# Patient Record
Sex: Male | Born: 1945 | Race: White | Hispanic: No | Marital: Married | Smoking: Never smoker
Health system: Southern US, Community
[De-identification: ages and names within clinical notes are randomized; demographics above are authoritative.]

## PROBLEM LIST (undated history)

## (undated) DIAGNOSIS — T4145XA Adverse effect of unspecified anesthetic, initial encounter: Secondary | ICD-10-CM

## (undated) DIAGNOSIS — E039 Hypothyroidism, unspecified: Secondary | ICD-10-CM

## (undated) DIAGNOSIS — N138 Other obstructive and reflux uropathy: Secondary | ICD-10-CM

## (undated) DIAGNOSIS — F32A Depression, unspecified: Secondary | ICD-10-CM

## (undated) DIAGNOSIS — Z973 Presence of spectacles and contact lenses: Secondary | ICD-10-CM

## (undated) DIAGNOSIS — Z8781 Personal history of (healed) traumatic fracture: Secondary | ICD-10-CM

## (undated) DIAGNOSIS — F329 Major depressive disorder, single episode, unspecified: Secondary | ICD-10-CM

## (undated) DIAGNOSIS — K5790 Diverticulosis of intestine, part unspecified, without perforation or abscess without bleeding: Secondary | ICD-10-CM

## (undated) DIAGNOSIS — N401 Enlarged prostate with lower urinary tract symptoms: Secondary | ICD-10-CM

## (undated) DIAGNOSIS — T8859XA Other complications of anesthesia, initial encounter: Secondary | ICD-10-CM

## (undated) DIAGNOSIS — M1990 Unspecified osteoarthritis, unspecified site: Secondary | ICD-10-CM

## (undated) HISTORY — PX: CHOLECYSTECTOMY: SHX55

## (undated) HISTORY — PX: COLONOSCOPY: SHX174

## (undated) HISTORY — PX: KNEE ARTHROSCOPY: SUR90

## (undated) HISTORY — PX: CIRCUMCISION: SUR203

---
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
## 1978-09-19 HISTORY — PX: BACK SURGERY: SHX140

## 1978-09-19 HISTORY — PX: KNEE ARTHROSCOPY AND ARTHROTOMY: SUR84

## 2006-08-24 DIAGNOSIS — K5730 Diverticulosis of large intestine without perforation or abscess without bleeding: Secondary | ICD-10-CM | POA: Insufficient documentation

## 2009-12-11 DIAGNOSIS — E785 Hyperlipidemia, unspecified: Secondary | ICD-10-CM | POA: Insufficient documentation

## 2009-12-13 DIAGNOSIS — Z9889 Other specified postprocedural states: Secondary | ICD-10-CM | POA: Insufficient documentation

## 2015-08-18 DIAGNOSIS — R1032 Left lower quadrant pain: Secondary | ICD-10-CM | POA: Diagnosis not present

## 2015-08-26 DIAGNOSIS — E039 Hypothyroidism, unspecified: Secondary | ICD-10-CM | POA: Diagnosis not present

## 2015-08-26 DIAGNOSIS — N400 Enlarged prostate without lower urinary tract symptoms: Secondary | ICD-10-CM | POA: Diagnosis not present

## 2015-08-26 DIAGNOSIS — E782 Mixed hyperlipidemia: Secondary | ICD-10-CM | POA: Diagnosis not present

## 2015-08-26 DIAGNOSIS — M1991 Primary osteoarthritis, unspecified site: Secondary | ICD-10-CM | POA: Diagnosis not present

## 2015-09-02 DIAGNOSIS — Z8042 Family history of malignant neoplasm of prostate: Secondary | ICD-10-CM | POA: Diagnosis not present

## 2015-09-02 DIAGNOSIS — Z0001 Encounter for general adult medical examination with abnormal findings: Secondary | ICD-10-CM | POA: Diagnosis not present

## 2015-09-02 DIAGNOSIS — E039 Hypothyroidism, unspecified: Secondary | ICD-10-CM | POA: Diagnosis not present

## 2015-09-02 DIAGNOSIS — E782 Mixed hyperlipidemia: Secondary | ICD-10-CM | POA: Diagnosis not present

## 2015-09-02 DIAGNOSIS — N400 Enlarged prostate without lower urinary tract symptoms: Secondary | ICD-10-CM | POA: Diagnosis not present

## 2015-09-02 DIAGNOSIS — Z23 Encounter for immunization: Secondary | ICD-10-CM | POA: Diagnosis not present

## 2015-11-24 DIAGNOSIS — R972 Elevated prostate specific antigen [PSA]: Secondary | ICD-10-CM | POA: Diagnosis not present

## 2015-12-03 DIAGNOSIS — R972 Elevated prostate specific antigen [PSA]: Secondary | ICD-10-CM | POA: Diagnosis not present

## 2015-12-30 DIAGNOSIS — R972 Elevated prostate specific antigen [PSA]: Secondary | ICD-10-CM | POA: Diagnosis not present

## 2016-01-07 DIAGNOSIS — R972 Elevated prostate specific antigen [PSA]: Secondary | ICD-10-CM | POA: Diagnosis not present

## 2016-03-02 DIAGNOSIS — E039 Hypothyroidism, unspecified: Secondary | ICD-10-CM | POA: Diagnosis not present

## 2016-07-08 DIAGNOSIS — R972 Elevated prostate specific antigen [PSA]: Secondary | ICD-10-CM | POA: Diagnosis not present

## 2016-07-19 DIAGNOSIS — R39198 Other difficulties with micturition: Secondary | ICD-10-CM | POA: Diagnosis not present

## 2016-07-19 DIAGNOSIS — Z8042 Family history of malignant neoplasm of prostate: Secondary | ICD-10-CM | POA: Diagnosis not present

## 2016-07-19 DIAGNOSIS — R972 Elevated prostate specific antigen [PSA]: Secondary | ICD-10-CM | POA: Diagnosis not present

## 2016-09-02 DIAGNOSIS — R972 Elevated prostate specific antigen [PSA]: Secondary | ICD-10-CM | POA: Diagnosis not present

## 2016-09-02 DIAGNOSIS — E782 Mixed hyperlipidemia: Secondary | ICD-10-CM | POA: Diagnosis not present

## 2016-09-02 DIAGNOSIS — E039 Hypothyroidism, unspecified: Secondary | ICD-10-CM | POA: Diagnosis not present

## 2016-09-06 DIAGNOSIS — E039 Hypothyroidism, unspecified: Secondary | ICD-10-CM | POA: Diagnosis not present

## 2016-09-06 DIAGNOSIS — Z0001 Encounter for general adult medical examination with abnormal findings: Secondary | ICD-10-CM | POA: Diagnosis not present

## 2016-09-06 DIAGNOSIS — N400 Enlarged prostate without lower urinary tract symptoms: Secondary | ICD-10-CM | POA: Diagnosis not present

## 2016-09-06 DIAGNOSIS — E782 Mixed hyperlipidemia: Secondary | ICD-10-CM | POA: Diagnosis not present

## 2016-09-06 DIAGNOSIS — Z8042 Family history of malignant neoplasm of prostate: Secondary | ICD-10-CM | POA: Diagnosis not present

## 2016-12-07 DIAGNOSIS — E039 Hypothyroidism, unspecified: Secondary | ICD-10-CM | POA: Diagnosis not present

## 2017-01-10 DIAGNOSIS — H1132 Conjunctival hemorrhage, left eye: Secondary | ICD-10-CM | POA: Diagnosis not present

## 2017-02-03 ENCOUNTER — Ambulatory Visit (INDEPENDENT_AMBULATORY_CARE_PROVIDER_SITE_OTHER): Payer: Medicare HMO | Admitting: Urology

## 2017-02-03 DIAGNOSIS — R3912 Poor urinary stream: Secondary | ICD-10-CM | POA: Diagnosis not present

## 2017-02-03 DIAGNOSIS — N401 Enlarged prostate with lower urinary tract symptoms: Secondary | ICD-10-CM

## 2017-02-03 DIAGNOSIS — R972 Elevated prostate specific antigen [PSA]: Secondary | ICD-10-CM | POA: Diagnosis not present

## 2017-02-27 DIAGNOSIS — M5416 Radiculopathy, lumbar region: Secondary | ICD-10-CM | POA: Diagnosis not present

## 2017-02-27 DIAGNOSIS — Z6827 Body mass index (BMI) 27.0-27.9, adult: Secondary | ICD-10-CM | POA: Diagnosis not present

## 2017-02-27 DIAGNOSIS — M7551 Bursitis of right shoulder: Secondary | ICD-10-CM | POA: Diagnosis not present

## 2017-07-20 DIAGNOSIS — R69 Illness, unspecified: Secondary | ICD-10-CM | POA: Diagnosis not present

## 2017-08-04 ENCOUNTER — Ambulatory Visit: Payer: Medicare HMO | Admitting: Urology

## 2017-08-04 DIAGNOSIS — R972 Elevated prostate specific antigen [PSA]: Secondary | ICD-10-CM

## 2017-08-04 DIAGNOSIS — R3912 Poor urinary stream: Secondary | ICD-10-CM

## 2017-08-04 DIAGNOSIS — N401 Enlarged prostate with lower urinary tract symptoms: Secondary | ICD-10-CM | POA: Diagnosis not present

## 2017-08-16 ENCOUNTER — Other Ambulatory Visit: Payer: Self-pay | Admitting: Urology

## 2017-08-16 DIAGNOSIS — R972 Elevated prostate specific antigen [PSA]: Secondary | ICD-10-CM

## 2017-08-24 ENCOUNTER — Ambulatory Visit (HOSPITAL_COMMUNITY)
Admission: RE | Admit: 2017-08-24 | Discharge: 2017-08-24 | Disposition: A | Discharge status: Home / Self Care | Payer: Medicare HMO | Source: Ambulatory Visit | Attending: Urology | Admitting: Urology

## 2017-08-24 DIAGNOSIS — R972 Elevated prostate specific antigen [PSA]: Secondary | ICD-10-CM | POA: Diagnosis not present

## 2017-08-24 DIAGNOSIS — N400 Enlarged prostate without lower urinary tract symptoms: Secondary | ICD-10-CM | POA: Diagnosis not present

## 2017-08-24 LAB — POCT I-STAT CREATININE: CREATININE: 0.9 mg/dL (ref 0.61–1.24)

## 2017-08-24 MED ORDER — GADOBENATE DIMEGLUMINE 529 MG/ML IV SOLN
20.0000 mL | Freq: Once | INTRAVENOUS | Status: AC | PRN
  Administered 2017-08-24: 19 mL via INTRAVENOUS

## 2017-08-24 MED ORDER — LIDOCAINE HCL 2 % EX GEL
CUTANEOUS | Status: AC
  Filled 2017-08-24: qty 30

## 2017-09-05 DIAGNOSIS — R972 Elevated prostate specific antigen [PSA]: Secondary | ICD-10-CM | POA: Diagnosis not present

## 2017-09-05 DIAGNOSIS — E039 Hypothyroidism, unspecified: Secondary | ICD-10-CM | POA: Diagnosis not present

## 2017-09-05 DIAGNOSIS — E782 Mixed hyperlipidemia: Secondary | ICD-10-CM | POA: Diagnosis not present

## 2017-09-05 DIAGNOSIS — M1991 Primary osteoarthritis, unspecified site: Secondary | ICD-10-CM | POA: Diagnosis not present

## 2017-09-05 DIAGNOSIS — Z8042 Family history of malignant neoplasm of prostate: Secondary | ICD-10-CM | POA: Diagnosis not present

## 2017-09-29 DIAGNOSIS — E782 Mixed hyperlipidemia: Secondary | ICD-10-CM | POA: Diagnosis not present

## 2017-09-29 DIAGNOSIS — Z0001 Encounter for general adult medical examination with abnormal findings: Secondary | ICD-10-CM | POA: Diagnosis not present

## 2017-09-29 DIAGNOSIS — N401 Enlarged prostate with lower urinary tract symptoms: Secondary | ICD-10-CM | POA: Diagnosis not present

## 2017-09-29 DIAGNOSIS — L570 Actinic keratosis: Secondary | ICD-10-CM | POA: Diagnosis not present

## 2017-09-29 DIAGNOSIS — Z6827 Body mass index (BMI) 27.0-27.9, adult: Secondary | ICD-10-CM | POA: Diagnosis not present

## 2017-09-29 DIAGNOSIS — R972 Elevated prostate specific antigen [PSA]: Secondary | ICD-10-CM | POA: Diagnosis not present

## 2017-09-29 DIAGNOSIS — E039 Hypothyroidism, unspecified: Secondary | ICD-10-CM | POA: Diagnosis not present

## 2018-02-16 ENCOUNTER — Ambulatory Visit: Payer: Medicare HMO | Admitting: Urology

## 2018-02-16 DIAGNOSIS — R3912 Poor urinary stream: Secondary | ICD-10-CM

## 2018-02-16 DIAGNOSIS — N401 Enlarged prostate with lower urinary tract symptoms: Secondary | ICD-10-CM

## 2018-02-16 DIAGNOSIS — R972 Elevated prostate specific antigen [PSA]: Secondary | ICD-10-CM | POA: Diagnosis not present

## 2018-03-29 DIAGNOSIS — E039 Hypothyroidism, unspecified: Secondary | ICD-10-CM | POA: Diagnosis not present

## 2018-07-10 DIAGNOSIS — R69 Illness, unspecified: Secondary | ICD-10-CM | POA: Diagnosis not present

## 2018-08-22 DIAGNOSIS — R972 Elevated prostate specific antigen [PSA]: Secondary | ICD-10-CM | POA: Diagnosis not present

## 2018-08-22 DIAGNOSIS — R338 Other retention of urine: Secondary | ICD-10-CM | POA: Diagnosis not present

## 2018-08-27 DIAGNOSIS — R338 Other retention of urine: Secondary | ICD-10-CM | POA: Diagnosis not present

## 2018-08-27 DIAGNOSIS — N401 Enlarged prostate with lower urinary tract symptoms: Secondary | ICD-10-CM | POA: Diagnosis not present

## 2018-09-13 DIAGNOSIS — N401 Enlarged prostate with lower urinary tract symptoms: Secondary | ICD-10-CM | POA: Diagnosis not present

## 2018-09-13 DIAGNOSIS — R972 Elevated prostate specific antigen [PSA]: Secondary | ICD-10-CM | POA: Diagnosis not present

## 2018-09-13 DIAGNOSIS — R338 Other retention of urine: Secondary | ICD-10-CM | POA: Diagnosis not present

## 2018-09-27 DIAGNOSIS — R972 Elevated prostate specific antigen [PSA]: Secondary | ICD-10-CM | POA: Diagnosis not present

## 2018-09-27 DIAGNOSIS — N401 Enlarged prostate with lower urinary tract symptoms: Secondary | ICD-10-CM | POA: Diagnosis not present

## 2018-09-27 DIAGNOSIS — R338 Other retention of urine: Secondary | ICD-10-CM | POA: Diagnosis not present

## 2018-09-27 DIAGNOSIS — N3000 Acute cystitis without hematuria: Secondary | ICD-10-CM | POA: Diagnosis not present

## 2018-09-27 DIAGNOSIS — N451 Epididymitis: Secondary | ICD-10-CM | POA: Diagnosis not present

## 2018-09-27 DIAGNOSIS — R3912 Poor urinary stream: Secondary | ICD-10-CM | POA: Diagnosis not present

## 2018-10-09 DIAGNOSIS — E039 Hypothyroidism, unspecified: Secondary | ICD-10-CM | POA: Diagnosis not present

## 2018-10-09 DIAGNOSIS — E782 Mixed hyperlipidemia: Secondary | ICD-10-CM | POA: Diagnosis not present

## 2018-10-09 DIAGNOSIS — N400 Enlarged prostate without lower urinary tract symptoms: Secondary | ICD-10-CM | POA: Diagnosis not present

## 2018-10-09 DIAGNOSIS — Z8042 Family history of malignant neoplasm of prostate: Secondary | ICD-10-CM | POA: Diagnosis not present

## 2018-10-17 DIAGNOSIS — L0100 Impetigo, unspecified: Secondary | ICD-10-CM | POA: Diagnosis not present

## 2018-10-17 DIAGNOSIS — Z6827 Body mass index (BMI) 27.0-27.9, adult: Secondary | ICD-10-CM | POA: Diagnosis not present

## 2018-10-19 DIAGNOSIS — R338 Other retention of urine: Secondary | ICD-10-CM | POA: Diagnosis not present

## 2018-10-19 DIAGNOSIS — N401 Enlarged prostate with lower urinary tract symptoms: Secondary | ICD-10-CM | POA: Diagnosis not present

## 2018-10-22 ENCOUNTER — Other Ambulatory Visit: Payer: Self-pay | Admitting: Urology

## 2018-10-22 DIAGNOSIS — Z0001 Encounter for general adult medical examination with abnormal findings: Secondary | ICD-10-CM | POA: Diagnosis not present

## 2018-10-22 DIAGNOSIS — Z6827 Body mass index (BMI) 27.0-27.9, adult: Secondary | ICD-10-CM | POA: Diagnosis not present

## 2018-10-24 DIAGNOSIS — L0100 Impetigo, unspecified: Secondary | ICD-10-CM | POA: Diagnosis not present

## 2018-10-24 DIAGNOSIS — Z6828 Body mass index (BMI) 28.0-28.9, adult: Secondary | ICD-10-CM | POA: Diagnosis not present

## 2018-12-04 DIAGNOSIS — N401 Enlarged prostate with lower urinary tract symptoms: Secondary | ICD-10-CM | POA: Diagnosis not present

## 2018-12-04 DIAGNOSIS — R338 Other retention of urine: Secondary | ICD-10-CM | POA: Diagnosis not present

## 2018-12-05 NOTE — Progress Notes (Signed)
Pt called related to the COVID-19 presurgical screening. Pt denies cough, fever >100.4, runny nose, sore throat, difficulty breathing/shortness of breath, and domestic/international travel within the last 14 days.

## 2018-12-05 NOTE — Patient Instructions (Addendum)
Hector Kelley  12/05/2018   Your procedure is scheduled on: 12-10-18    Report to [facility] Main  Entrance    Report to [place] at 5:30 AM    Call this number if you have problems the morning of surgery [phone]   Remember: Do not eat food or drink liquids :After Midnight.    BRUSH YOUR TEETH MORNING OF SURGERY AND RINSE YOUR MOUTH OUT, NO CHEWING GUM CANDY OR MINTS.     CLEAR LIQUID DIET   Foods Allowed                                                                     Foods Excluded  Coffee and tea, regular and decaf                             liquids that you cannot  Plain Jell-O in any flavor                                             see through such as: Fruit ices (not with fruit pulp)                                     milk, soups, orange juice  Iced Popsicles                                    All solid food Carbonated beverages, regular and diet                                    Cranberry, grape and apple juices Sports drinks like Gatorade Lightly seasoned clear broth or consume(fat free) Sugar, honey syrup  Sample Menu Breakfast                                Lunch                                     Supper Cranberry juice                    Beef broth                            Chicken broth Jell-O                                     Grape juice  Apple juice Coffee or tea                        Jell-O                                      Popsicle                                                Coffee or tea                        Coffee or tea  _____________________________________________________________________     Take these medicines the morning of surgery with A SIP OF WATER: Levothyroxine (Synthroid), and Finasteride (Proscar)                                You may not have any metal on your body including hair pins and              piercings  Do not wear jewelry, cologne, lotions, powders or  deodorant             Men may shave face and neck.   Do not bring valuables to the hospital. [place].  Contacts, dentures or bridgework may not be worn into surgery.  Leave suitcase in the car. After surgery it may be brought to your room.     Patients discharged the day of surgery will not be allowed to drive home. IF YOU ARE HAVING SURGERY AND GOING HOME THE SAME DAY, YOU MUST HAVE AN ADULT TO DRIVE YOU HOME AND BE WITH YOU FOR 24 HOURS. YOU MAY GO HOME BY TAXI OR UBER OR ORTHERWISE, BUT AN ADULT MUST ACCOMPANY YOU HOME AND STAY WITH YOU FOR 24 HOURS.   Special Instructions: Please consume a Clear Liquid Diet along with your prep             Please read over the following fact sheets you were given: _____________________________________________________________________             [facility] - Preparing for Surgery Before surgery, you can play an important role.  Because skin is not sterile, your skin needs to be as free of germs as possible.  You can reduce the number of germs on your skin by washing with CHG (chlorahexidine gluconate) soap before surgery.  CHG is an antiseptic cleaner which kills germs and bonds with the skin to continue killing germs even after washing. Please DO NOT use if you have an allergy to CHG or antibacterial soaps.  If your skin becomes reddened/irritated stop using the CHG and inform your nurse when you arrive at Short Stay. Do not shave (including legs and underarms) for at least 48 hours prior to the first CHG shower.  You may shave your face/neck. Please follow these instructions carefully:  1.  Shower with CHG Soap the night before surgery and the  morning of Surgery.  2.  If you choose to wash your hair, wash your hair first as usual with your  normal  shampoo.  3.  After you shampoo, rinse your hair and body thoroughly to remove the  shampoo.                           4.  Use CHG as you would any other  liquid soap.  You can apply chg directly  to the skin and wash                       Gently with a scrungie or clean washcloth.  5.  Apply the CHG Soap to your body ONLY FROM THE NECK DOWN.   Do not use on face/ open                           Wound or open sores. Avoid contact with eyes, ears mouth and genitals (private parts).                       Wash face,  Genitals (private parts) with your normal soap.             6.  Wash thoroughly, paying special attention to the area where your surgery  will be performed.  7.  Thoroughly rinse your body with warm water from the neck down.  8.  DO NOT shower/wash with your normal soap after using and rinsing off  the CHG Soap.                9.  Pat yourself dry with a clean towel.            10.  Wear clean pajamas.            11.  Place clean sheets on your bed the night of your first shower and do not  sleep with pets. Day of Surgery : Do not apply any lotions/deodorants the morning of surgery.  Please wear clean clothes to the hospital/surgery center.  FAILURE TO FOLLOW THESE INSTRUCTIONS MAY RESULT IN THE CANCELLATION OF YOUR SURGERY PATIENT SIGNATURE_________________________________  NURSE SIGNATURE__________________________________  ________________________________________________________________________

## 2018-12-07 ENCOUNTER — Encounter (HOSPITAL_COMMUNITY)
Admission: RE | Admit: 2018-12-07 | Discharge: 2018-12-07 | Disposition: A | Discharge status: Home / Self Care | Payer: Medicare HMO | Source: Ambulatory Visit | Attending: Urology | Admitting: Urology

## 2018-12-07 ENCOUNTER — Other Ambulatory Visit: Payer: Self-pay

## 2018-12-07 ENCOUNTER — Encounter (HOSPITAL_COMMUNITY): Payer: Self-pay

## 2018-12-07 ENCOUNTER — Encounter (HOSPITAL_COMMUNITY): Payer: Self-pay | Admitting: Physician Assistant

## 2018-12-07 DIAGNOSIS — Z01812 Encounter for preprocedural laboratory examination: Secondary | ICD-10-CM | POA: Insufficient documentation

## 2018-12-07 DIAGNOSIS — R339 Retention of urine, unspecified: Secondary | ICD-10-CM | POA: Insufficient documentation

## 2018-12-07 DIAGNOSIS — N401 Enlarged prostate with lower urinary tract symptoms: Secondary | ICD-10-CM | POA: Diagnosis not present

## 2018-12-07 HISTORY — DX: Depression, unspecified: F32.A

## 2018-12-07 HISTORY — DX: Unspecified osteoarthritis, unspecified site: M19.90

## 2018-12-07 HISTORY — DX: Major depressive disorder, single episode, unspecified: F32.9

## 2018-12-07 HISTORY — DX: Hypothyroidism, unspecified: E03.9

## 2018-12-07 LAB — CBC
HCT: 43.3 % (ref 39.0–52.0)
Hemoglobin: 13.8 g/dL (ref 13.0–17.0)
MCH: 30.7 pg (ref 26.0–34.0)
MCHC: 31.9 g/dL (ref 30.0–36.0)
MCV: 96.2 fL (ref 80.0–100.0)
Platelets: 319 10*3/uL (ref 150–400)
RBC: 4.5 MIL/uL (ref 4.22–5.81)
RDW: 13.4 % (ref 11.5–15.5)
WBC: 5.4 10*3/uL (ref 4.0–10.5)
nRBC: 0 % (ref 0.0–0.2)

## 2018-12-07 LAB — BASIC METABOLIC PANEL
Anion gap: 6 (ref 5–15)
BUN: 16 mg/dL (ref 8–23)
CO2: 26 mmol/L (ref 22–32)
Calcium: 9.1 mg/dL (ref 8.9–10.3)
Chloride: 108 mmol/L (ref 98–111)
Creatinine, Ser: 1.01 mg/dL (ref 0.61–1.24)
GFR calc Af Amer: >60 mL/min (ref >60)
GFR calc non Af Amer: >60 mL/min (ref >60)
GLUCOSE: 100 mg/dL — AB (ref 70–99)
Potassium: 4.2 mmol/L (ref 3.5–5.1)
Sodium: 140 mmol/L (ref 135–145)

## 2018-12-10 ENCOUNTER — Encounter (HOSPITAL_COMMUNITY): Admission: RE | Payer: Self-pay | Source: Home / Self Care

## 2018-12-10 ENCOUNTER — Inpatient Hospital Stay (HOSPITAL_COMMUNITY): Admission: RE | Admit: 2018-12-10 | Payer: Medicare HMO | Source: Home / Self Care | Admitting: Urology

## 2018-12-10 SURGERY — PROSTATECTOMY, SIMPLE, ROBOT-ASSISTED
Anesthesia: General

## 2018-12-11 ENCOUNTER — Other Ambulatory Visit: Payer: Self-pay | Admitting: Urology

## 2018-12-21 IMAGING — MR MR PROSTATE WO/W CM
23 of 56 series · 23 of 56 positions shown · IV contrast (multihance)
Comparison: None.

CLINICAL DATA: Elevated PSA level. Benign findings on biopsy
05/21/2015

EXAM:
MR PROSTATE WITHOUT AND WITH CONTRAST
TECHNIQUE: Multiplanar multisequence MRI images were obtained of the pelvis
centered about the prostate. Pre and post contrast images were
obtained.
CONTRAST:  19mL MULTIHANCE GADOBENATE DIMEGLUMINE 529 MG/ML IV SOLN

[Series 3: bSSFP fat-sat · axial · 6.0mm · 0.86mm/px · 1 of 44 slices shown]
[im 1/44]
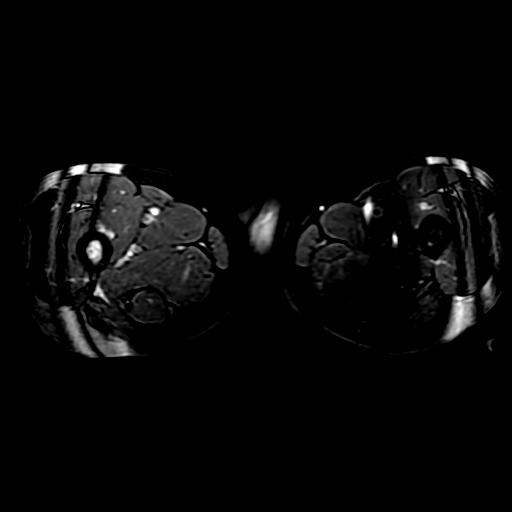

[Series 4: T1 · axial · 6.0mm · 0.86mm/px · 1 of 44 slices shown (1 of 2)]
[im 1/44]
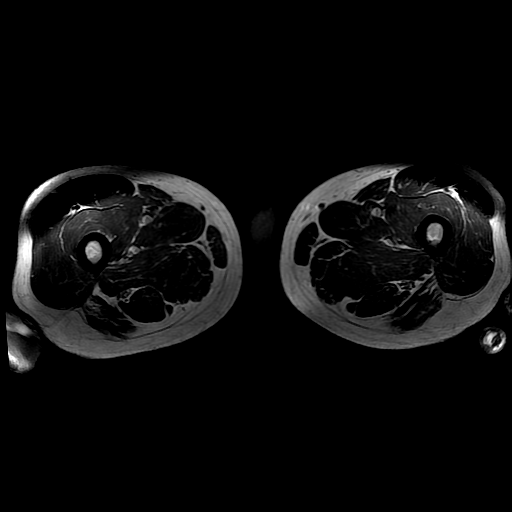

[Series 5: T2 · axial · 3.0mm · 0.29mm/px · 1 of 28 slices shown (1 of 4)]
[im 1/28]
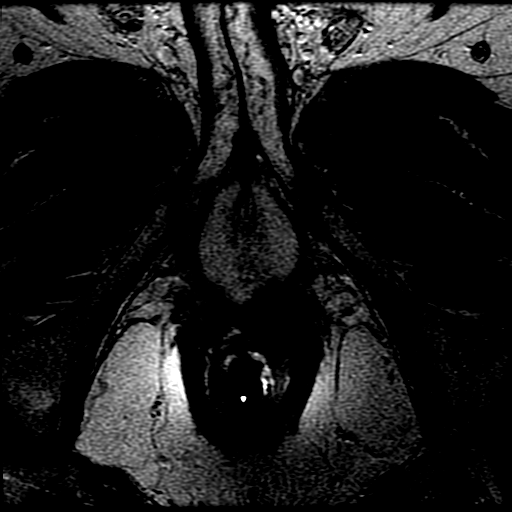

[Series 6: T1 · axial · 3.0mm · 0.29mm/px · 1 of 28 slices shown (2 of 2)]
[im 1/28]
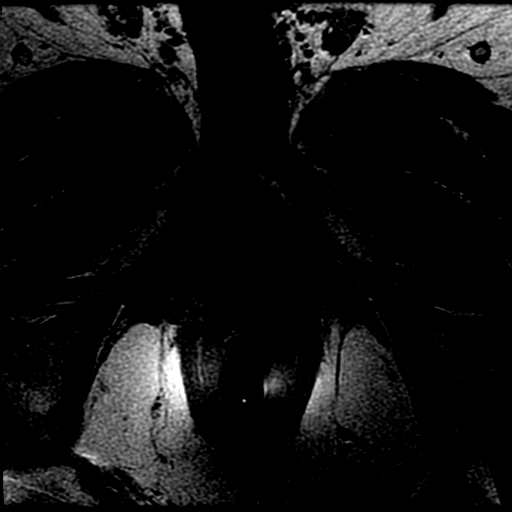

[Series 7: T2 · axial · 1.8mm · 0.47mm/px · 1 of 156 slices shown (2 of 4)]
[im 1/156]
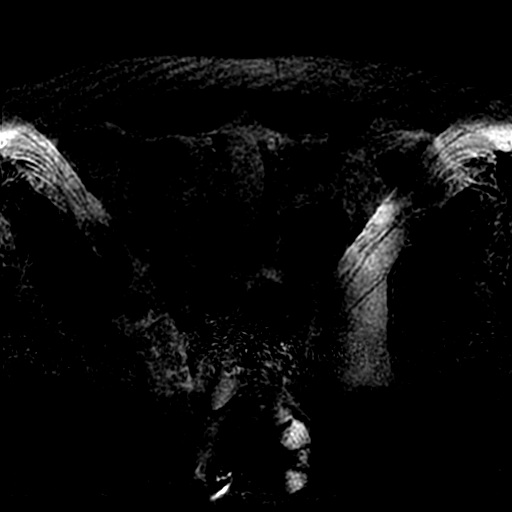

[Series 8: T2 · sagittal · 4.0mm · 0.29mm/px · 1 of 26 slices shown (3 of 4)]
[im 1/26]
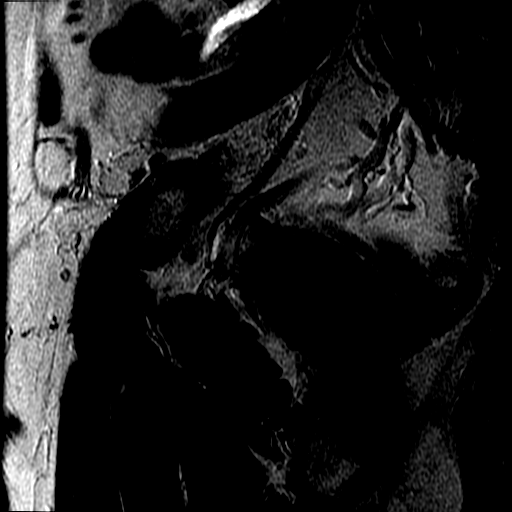

[Series 9: T2 · coronal · 4.0mm · 0.29mm/px · 1 of 28 slices shown (4 of 4)]
[im 1/28]
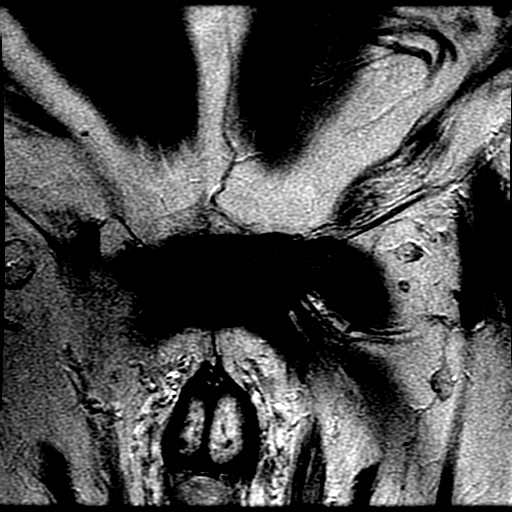

[Series 10: DWI · axial · 3.0mm · 0.59mm/px · 1 of 52 slices shown (1 of 6)]
[im 1/52]
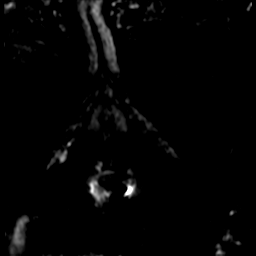

[Series 11: DWI · axial · 3.0mm · 0.59mm/px · 1 of 52 slices shown (2 of 6)]
[im 1/52]
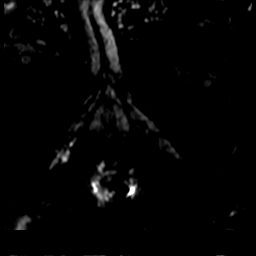

[Series 12: DWI · axial · 3.0mm · 0.59mm/px · 1 of 51 slices shown (3 of 6)]
[im 1/51]
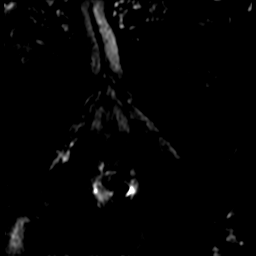

[Series 700: reformatted · axial · 1.8mm · 0.47mm/px · 1 of 70 slices shown (1 of 2)]
[im 1/70]
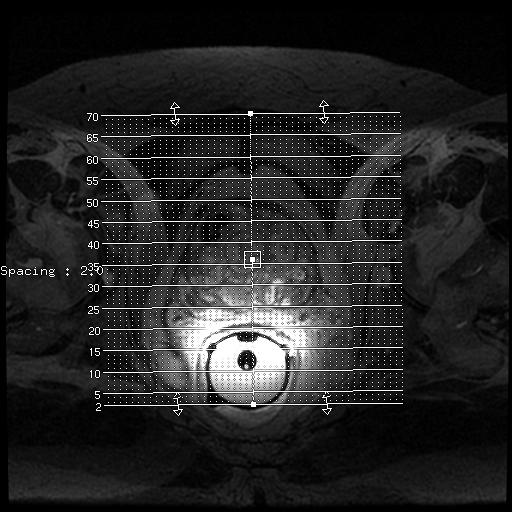

[Series 701: reformatted · axial · 1.8mm · 0.47mm/px · 1 of 64 slices shown (2 of 2)]
[im 1/64]
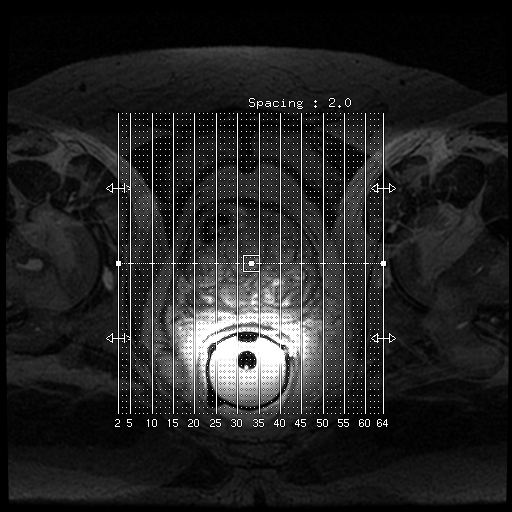

[Series 1000: DWI · axial · 3.0mm · 0.59mm/px · 1 of 26 slices shown (4 of 6)]
[im 1/26]
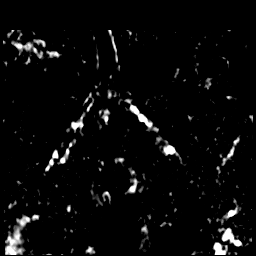

[Series 1100: DWI · axial · 3.0mm · 0.59mm/px · 1 of 26 slices shown (5 of 6)]
[im 1/26]
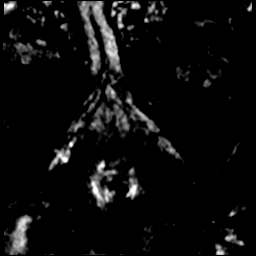

[Series 1200: DWI · axial · 3.0mm · 0.59mm/px · 1 of 26 slices shown (6 of 6)]
[im 1/26]
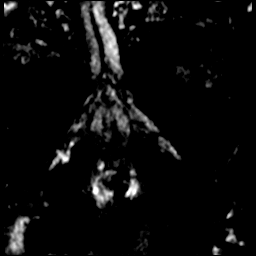

[((id)/(id)/1)-((id)/(id)/1) · axial · 3.0mm · 0.43mm/px · 1 of 73 slices shown (1 of 8)]
[im 1/73]
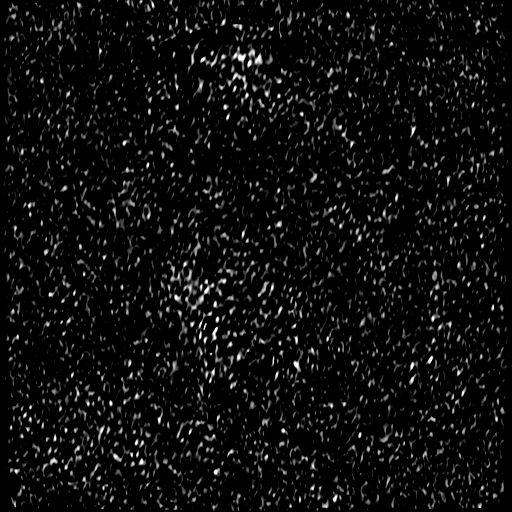

[((id)/(id)/1)-((id)/(id)/1) · axial · 3.0mm · 0.43mm/px · 1 of 75 slices shown (2 of 8)]
[im 1/75]
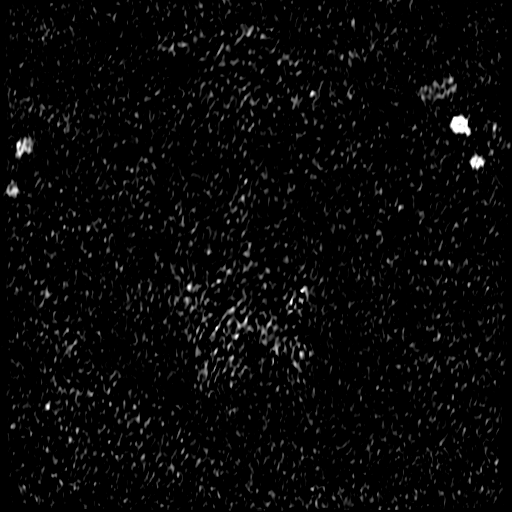

[((id)/(id)/1)-((id)/(id)/1) · axial · 3.0mm · 0.43mm/px · 1 of 62 slices shown (3 of 8)]
[im 1/62]
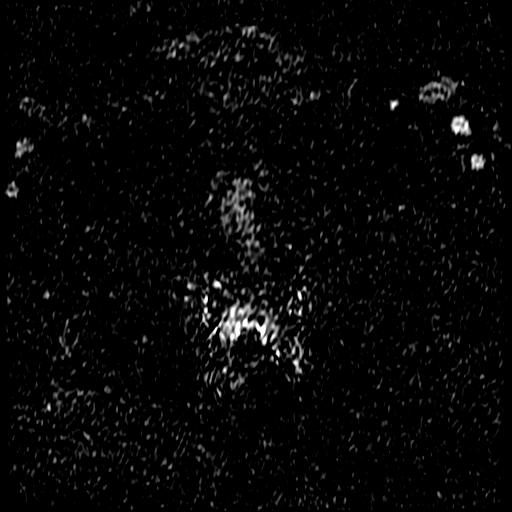

[((id)/(id)/1)-((id)/(id)/1) · axial · 3.0mm · 0.43mm/px · 1 of 62 slices shown (4 of 8)]
[im 1/62]
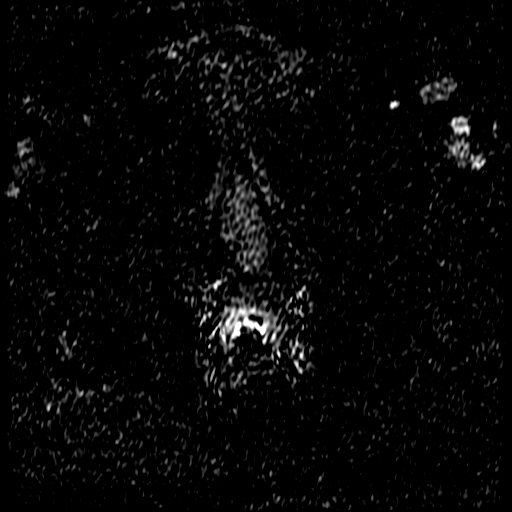

[((id)/(id)/1)-((id)/(id)/1) · axial · 3.0mm · 0.43mm/px · 1 of 64 slices shown (5 of 8)]
[im 1/64]
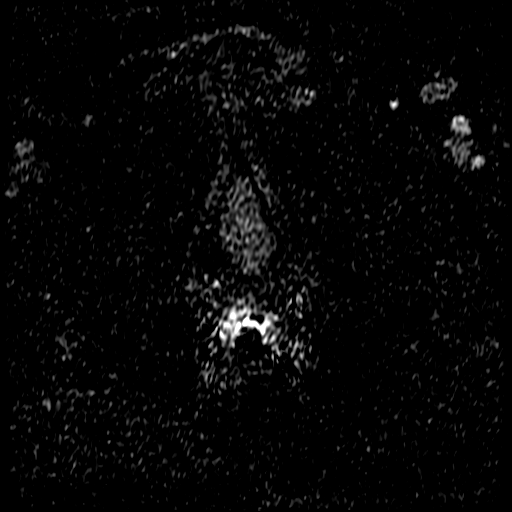

[((id)/(id)/1)-((id)/(id)/1) · axial · 3.0mm · 0.43mm/px · 1 of 64 slices shown (6 of 8)]
[im 1/64]
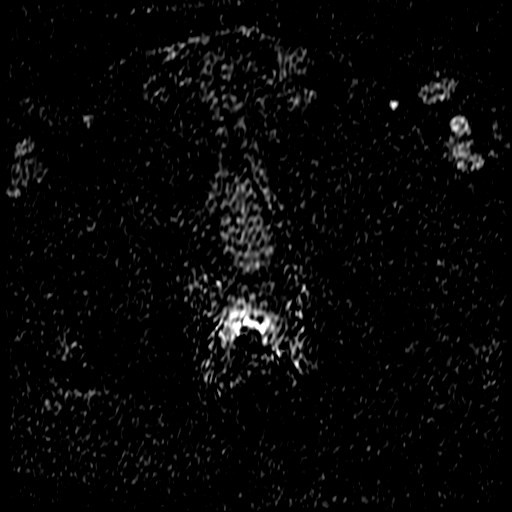

[((id)/(id)/1)-((id)/(id)/1) · axial · 3.0mm · 0.43mm/px · 1 of 64 slices shown (7 of 8)]
[im 1/64]
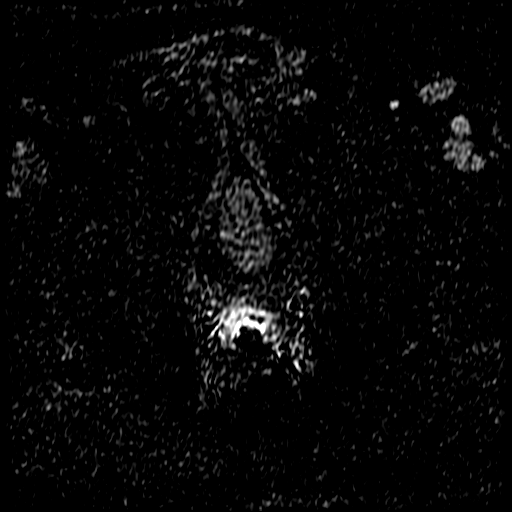

[((id)/(id)/1)-((id)/(id)/1) · axial · 3.0mm · 0.43mm/px · 1 of 64 slices shown (8 of 8)]
[im 1/64]
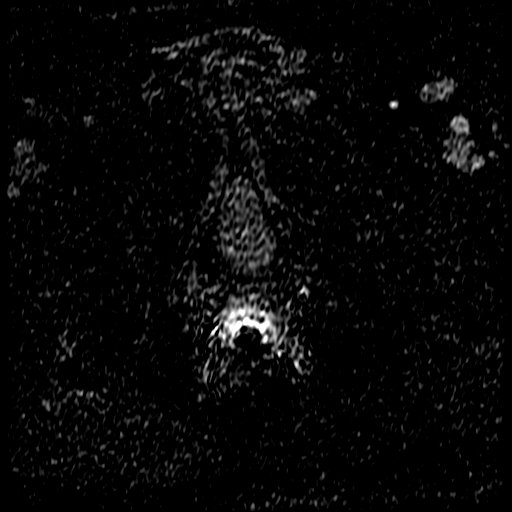

[23 of 56 positions shown; findings below may reference images not displayed]

FINDINGS: Prostate: Indistinct bandlike region of reduced T2 signal in the
right posterolateral peripheral zone mid gland compatible with
PI-RADS category 2 lesion, likely benign.

1.9 by 0.6 by 0.7 cm lesion in the right anterior transition zone
has low T2 and ADC map activity but also high T1 activity, and
accordingly probably represents proteinaceous or hemorrhagic
content. Low diffusion activity, less than that of the surrounding
central zone.

Benign prostatic hypertrophy noted.  Seminal vesicles unremarkable.

Volume: 143.5 cubic cm

Transcapsular spread:  Absent

Seminal vesicle involvement: Absent

Neurovascular bundle involvement: Absent

Pelvic adenopathy: Absent

Bone metastasis: Absent

Other findings: No supplemental non-categorized findings.
IMPRESSION: 1. No specific findings of prostate cancer. Likely benign right
posterolateral peripheral zone mid gland bandlike indistinct T2
signal, PI-RADS category 2. Small lesion in the right anterior
transition zone has high T1 activity and likely represents
proteinaceous or hemorrhagic content.
2. Benign prostatic hypertrophy, prostate volume 143.5 cubic cm.

## 2019-01-17 DIAGNOSIS — R338 Other retention of urine: Secondary | ICD-10-CM | POA: Diagnosis not present

## 2019-01-17 DIAGNOSIS — N401 Enlarged prostate with lower urinary tract symptoms: Secondary | ICD-10-CM | POA: Diagnosis not present

## 2019-01-30 ENCOUNTER — Other Ambulatory Visit: Payer: Self-pay | Admitting: Urology

## 2019-02-06 ENCOUNTER — Encounter (HOSPITAL_COMMUNITY): Payer: Self-pay

## 2019-02-06 NOTE — Progress Notes (Signed)
SPOKE W/  Priscilla     SCREENING SYMPTOMS OF COVID 19:   COUGH--NO  RUNNY NOSE--- NO  SORE THROAT---NO  NASAL CONGESTION----NO  SNEEZING----NO  SHORTNESS OF BREATH---NO  DIFFICULTY BREATHING---NO  TEMP >100.0 -----NO  UNEXPLAINED BODY ACHES------NO  CHILLS -------- NO  HEADACHES ---------NO  LOSS OF SMELL/ TASTE --------NO    HAVE YOU OR ANY FAMILY MEMBER TRAVELLED PAST 14 DAYS OUT OF THE   COUNTY---lives in [place] COUNTRY----NO  HAVE YOU OR ANY FAMILY MEMBER BEEN EXPOSED TO ANYONE WITH COVID 19? NO

## 2019-02-06 NOTE — Patient Instructions (Addendum)
DUE TO COVID-19 NO VISITORS ARE ALLOWED IN THE HOSPITAL AT THIS TIME   COVID SWAB TESTING MUST BE COMPLETED ON:  Friday, Feb 08, 2019 at 9:35AM   Your procedure is scheduled on: Thursday, Feb 14, 2019   Surgery Time:  7:30AM-10:30AM   Report to [place]  Entrance   Report to Short Stay at 5:30 AM   Call this number if you have problems the morning of surgery [phone]   Day before surgery liquid diet   CLEAR LIQUID DIET  Foods Allowed                                                                     Foods Excluded  Water, Black Coffee and tea, regular and decaf                             liquids that you cannot  Plain Jell-O in any flavor                                             see through such as: Fruit ices (not with fruit pulp)                                     milk, soups, orange juice  Iced Popsicles                                    All solid food Carbonated beverages, regular and diet                                    Cranberry, grape and apple juices Sports drinks like Gatorade Lightly seasoned clear broth or consume(fat free) Sugar, honey syrup  Sample Menu Breakfast                                Lunch                                     Supper Cranberry juice                    Beef broth                            Chicken broth Jell-O                                     Grape juice                           Apple juice Coffee or tea  Jell-O                                      Popsicle                                                Coffee or tea                        Coffee or tea   Do not eat food or drink liquids :After Midnight.   Brush your teeth the morning of surgery.   Do NOT smoke after Midnight   Take these medicines the morning of surgery with A SIP OF WATER: Levothyroxine, Cetirizine if needed   May use eye drops if needed                               You may not have any metal on your body  including jewelry, and body piercings             Do not wear lotions, powders, perfumes/cologne, or deodorant                           Men may shave face and neck.   Do not bring valuables to the hospital. [place].   Contacts, dentures or bridgework may not be worn into surgery.   Bring small overnight bag day of surgery.    Special Instructions: Bring a copy of your healthcare power of attorney and living will documents         the day of surgery if you haven't scanned them in before.              Please read over the following fact sheets you were given:  [facility] - Preparing for Surgery Before surgery, you can play an important role.  Because skin is not sterile, your skin needs to be as free of germs as possible.  You can reduce the number of germs on your skin by washing with CHG (chlorahexidine gluconate) soap before surgery.  CHG is an antiseptic cleaner which kills germs and bonds with the skin to continue killing germs even after washing. Please DO NOT use if you have an allergy to CHG or antibacterial soaps.  If your skin becomes reddened/irritated stop using the CHG and inform your nurse when you arrive at Short Stay. Do not shave (including legs and underarms) for at least 48 hours prior to the first CHG shower.  You may shave your face/neck.  Please follow these instructions carefully:  1.  Shower with CHG Soap the night before surgery and the  morning of surgery.  2.  If you choose to wash your hair, wash your hair first as usual with your normal  shampoo.  3.  After you shampoo, rinse your hair and body thoroughly to remove the shampoo.                             4.  Use CHG as  you would any other liquid soap.  You can apply chg directly to the skin and wash.  Gently with a scrungie or clean washcloth.  5.  Apply the CHG Soap to your body ONLY FROM THE NECK DOWN.   Do   not use on face/ open                            Wound or open sores. Avoid contact with eyes, ears mouth and   genitals (private parts).                       Wash face,  Genitals (private parts) with your normal soap.             6.  Wash thoroughly, paying special attention to the area where your    surgery  will be performed.  7.  Thoroughly rinse your body with warm water from the neck down.  8.  DO NOT shower/wash with your normal soap after using and rinsing off the CHG Soap.                9.  Pat yourself dry with a clean towel.            10.  Wear clean pajamas.            11.  Place clean sheets on your bed the night of your first shower and do not  sleep with pets. Day of Surgery : Do not apply any lotions/deodorants the morning of surgery.  Please wear clean clothes to the hospital/surgery center.  FAILURE TO FOLLOW THESE INSTRUCTIONS MAY RESULT IN THE CANCELLATION OF YOUR SURGERY  PATIENT SIGNATURE_________________________________  NURSE SIGNATURE__________________________________  ________________________________________________________________________

## 2019-02-07 ENCOUNTER — Other Ambulatory Visit: Payer: Self-pay

## 2019-02-07 ENCOUNTER — Encounter (HOSPITAL_COMMUNITY): Payer: Self-pay

## 2019-02-07 ENCOUNTER — Encounter (HOSPITAL_COMMUNITY)
Admission: RE | Admit: 2019-02-07 | Discharge: 2019-02-07 | Disposition: A | Discharge status: Home / Self Care | Payer: Medicare HMO | Source: Ambulatory Visit | Attending: Urology | Admitting: Urology

## 2019-02-07 DIAGNOSIS — Z01812 Encounter for preprocedural laboratory examination: Secondary | ICD-10-CM | POA: Insufficient documentation

## 2019-02-07 DIAGNOSIS — R338 Other retention of urine: Secondary | ICD-10-CM | POA: Insufficient documentation

## 2019-02-07 DIAGNOSIS — N401 Enlarged prostate with lower urinary tract symptoms: Secondary | ICD-10-CM | POA: Diagnosis not present

## 2019-02-07 HISTORY — DX: Personal history of (healed) traumatic fracture: Z87.81

## 2019-02-07 HISTORY — DX: Presence of spectacles and contact lenses: Z97.3

## 2019-02-07 HISTORY — DX: Other complications of anesthesia, initial encounter: T88.59XA

## 2019-02-07 HISTORY — DX: Diverticulosis of intestine, part unspecified, without perforation or abscess without bleeding: K57.90

## 2019-02-07 LAB — CBC
HCT: 45.3 % (ref 39.0–52.0)
Hemoglobin: 14.7 g/dL (ref 13.0–17.0)
MCH: 31 pg (ref 26.0–34.0)
MCHC: 32.5 g/dL (ref 30.0–36.0)
MCV: 95.6 fL (ref 80.0–100.0)
Platelets: 311 10*3/uL (ref 150–400)
RBC: 4.74 MIL/uL (ref 4.22–5.81)
RDW: 13.2 % (ref 11.5–15.5)
WBC: 6.3 10*3/uL (ref 4.0–10.5)
nRBC: 0 % (ref 0.0–0.2)

## 2019-02-07 MED ORDER — MAGNESIUM CITRATE PO SOLN
1.0000 | Freq: Once | ORAL | Status: DC
  Filled 2019-02-07: qty 296

## 2019-02-08 ENCOUNTER — Other Ambulatory Visit (HOSPITAL_COMMUNITY)
Admission: RE | Admit: 2019-02-08 | Discharge: 2019-02-08 | Disposition: A | Discharge status: Home / Self Care | Payer: Medicare HMO | Source: Ambulatory Visit | Attending: Urology | Admitting: Urology

## 2019-02-08 DIAGNOSIS — Z1159 Encounter for screening for other viral diseases: Secondary | ICD-10-CM | POA: Diagnosis not present

## 2019-02-09 LAB — NOVEL CORONAVIRUS, NAA (HOSP ORDER, SEND-OUT TO REF LAB; TAT 18-24 HRS): SARS-CoV-2, NAA: NOT DETECTED

## 2019-02-13 NOTE — Anesthesia Preprocedure Evaluation (Addendum)
Anesthesia Evaluation  Patient identified by MRN, date of birth, ID band Patient awake    Reviewed: Allergy & Precautions, NPO status , Patient's Chart, lab work & pertinent test results  History of Anesthesia Complications Negative for: history of anesthetic complications  Airway Mallampati: II  TM Distance: >3 FB Neck ROM: Full    Dental  (+) Dental Advisory Given   Pulmonary  02/08/2019 SARS Coronavirus NEG   breath sounds clear to auscultation       Cardiovascular negative cardio ROS   Rhythm:Regular Rate:Normal     Neuro/Psych negative neurological ROS     GI/Hepatic negative GI ROS, Neg liver ROS,   Endo/Other  Hypothyroidism   Renal/GU      Musculoskeletal  (+) Arthritis , Osteoarthritis,    Abdominal   Peds  Hematology negative hematology ROS (+)   Anesthesia Other Findings   Reproductive/Obstetrics                            Anesthesia Physical Anesthesia Plan  ASA: II  Anesthesia Plan: General   Post-op Pain Management:    Induction: Intravenous  PONV Risk Score and Plan: 2 and Ondansetron, Dexamethasone and Scopolamine patch - Pre-op  Airway Management Planned: Oral ETT  Additional Equipment:   Intra-op Plan:   Post-operative Plan: Extubation in OR  Informed Consent: I have reviewed the patients History and Physical, chart, labs and discussed the procedure including the risks, benefits and alternatives for the proposed anesthesia with the patient or authorized representative who has indicated his/her understanding and acceptance.     Dental advisory given  Plan Discussed with: CRNA and Surgeon  Anesthesia Plan Comments:         Anesthesia Quick Evaluation

## 2019-02-13 NOTE — Progress Notes (Signed)
SPOKE W/  _patient     SCREENING SYMPTOMS OF COVID 19:   COUGH--no  RUNNY NOSE--- no  SORE THROAT---no  NASAL CONGESTION----no  SNEEZING----no  SHORTNESS OF BREATH---no  DIFFICULTY BREATHING---no  TEMP >100.0 -----no  UNEXPLAINED BODY ACHES------no  CHILLS --------no   HEADACHES ---------no  LOSS OF SMELL/ TASTE --------no    HAVE YOU OR ANY FAMILY MEMBER TRAVELLED PAST 14 DAYS OUT OF THE   COUNTY---no STATE----no COUNTRY----no  HAVE YOU OR ANY FAMILY MEMBER BEEN EXPOSED TO ANYONE WITH COVID 19? no    

## 2019-02-14 ENCOUNTER — Encounter (HOSPITAL_COMMUNITY): Payer: Self-pay | Admitting: *Deleted

## 2019-02-14 ENCOUNTER — Encounter (HOSPITAL_COMMUNITY)
Admission: RE | Disposition: A | Discharge status: Home / Self Care | Payer: Self-pay | Source: Home / Self Care | Attending: Urology

## 2019-02-14 ENCOUNTER — Other Ambulatory Visit: Payer: Self-pay

## 2019-02-14 ENCOUNTER — Inpatient Hospital Stay (HOSPITAL_COMMUNITY): Payer: Medicare HMO | Admitting: Anesthesiology

## 2019-02-14 ENCOUNTER — Inpatient Hospital Stay (HOSPITAL_COMMUNITY)
Admission: RE | Admit: 2019-02-14 | Discharge: 2019-02-16 | DRG: 708 | Disposition: A | Discharge status: Home / Self Care | Payer: Medicare HMO | Attending: Urology | Admitting: Urology

## 2019-02-14 ENCOUNTER — Inpatient Hospital Stay (HOSPITAL_COMMUNITY): Payer: Medicare HMO | Admitting: Physician Assistant

## 2019-02-14 DIAGNOSIS — E039 Hypothyroidism, unspecified: Secondary | ICD-10-CM | POA: Diagnosis not present

## 2019-02-14 DIAGNOSIS — Z9049 Acquired absence of other specified parts of digestive tract: Secondary | ICD-10-CM | POA: Diagnosis not present

## 2019-02-14 DIAGNOSIS — N401 Enlarged prostate with lower urinary tract symptoms: Principal | ICD-10-CM | POA: Diagnosis present

## 2019-02-14 DIAGNOSIS — R338 Other retention of urine: Secondary | ICD-10-CM | POA: Diagnosis not present

## 2019-02-14 DIAGNOSIS — N400 Enlarged prostate without lower urinary tract symptoms: Secondary | ICD-10-CM | POA: Diagnosis not present

## 2019-02-14 DIAGNOSIS — N138 Other obstructive and reflux uropathy: Secondary | ICD-10-CM | POA: Diagnosis present

## 2019-02-14 HISTORY — DX: Benign prostatic hyperplasia with lower urinary tract symptoms: N40.1

## 2019-02-14 HISTORY — DX: Other obstructive and reflux uropathy: N13.8

## 2019-02-14 HISTORY — PX: XI ROBOTIC ASSISTED SIMPLE PROSTATECTOMY: SHX6713

## 2019-02-14 LAB — HEMOGLOBIN AND HEMATOCRIT, BLOOD
HCT: 42.9 % (ref 39.0–52.0)
Hemoglobin: 13.7 g/dL (ref 13.0–17.0)

## 2019-02-14 SURGERY — PROSTATECTOMY, SIMPLE, ROBOT-ASSISTED
Anesthesia: General

## 2019-02-14 MED ORDER — OXYCODONE HCL 5 MG PO TABS: 5.0000 mg | ORAL_TABLET | ORAL | Status: DC | PRN

## 2019-02-14 MED ORDER — ROCURONIUM BROMIDE 10 MG/ML (PF) SYRINGE
PREFILLED_SYRINGE | INTRAVENOUS | Status: DC | PRN
  Administered 2019-02-14: 40 mg via INTRAVENOUS
  Administered 2019-02-14 (×5): 20 mg via INTRAVENOUS

## 2019-02-14 MED ORDER — PROPOFOL 10 MG/ML IV BOLUS
INTRAVENOUS | Status: DC | PRN
  Administered 2019-02-14: 120 mg via INTRAVENOUS
  Administered 2019-02-14: 30 mg via INTRAVENOUS

## 2019-02-14 MED ORDER — DEXTROSE-NACL 5-0.45 % IV SOLN
INTRAVENOUS | Status: DC
  Administered 2019-02-14 – 2019-02-16 (×4): via INTRAVENOUS

## 2019-02-14 MED ORDER — PROMETHAZINE HCL 25 MG/ML IJ SOLN: 6.2500 mg | INTRAMUSCULAR | Status: DC | PRN

## 2019-02-14 MED ORDER — SUGAMMADEX SODIUM 200 MG/2ML IV SOLN
INTRAVENOUS | Status: DC | PRN
  Administered 2019-02-14: 200 mg via INTRAVENOUS

## 2019-02-14 MED ORDER — FINASTERIDE 5 MG PO TABS
5.0000 mg | ORAL_TABLET | Freq: Every day | ORAL | Status: DC
  Administered 2019-02-15: 5 mg via ORAL
  Filled 2019-02-14: qty 1

## 2019-02-14 MED ORDER — PHENYLEPHRINE HCL (PRESSORS) 10 MG/ML IV SOLN
INTRAVENOUS | Status: DC | PRN
  Administered 2019-02-14 (×2): 80 ug via INTRAVENOUS

## 2019-02-14 MED ORDER — SODIUM CHLORIDE (PF) 0.9 % IJ SOLN
INTRAMUSCULAR | Status: DC | PRN
  Administered 2019-02-14: 20 mL

## 2019-02-14 MED ORDER — FENTANYL CITRATE (PF) 100 MCG/2ML IJ SOLN
INTRAMUSCULAR | Status: DC | PRN
  Administered 2019-02-14 (×2): 100 ug via INTRAVENOUS
  Administered 2019-02-14 (×3): 50 ug via INTRAVENOUS
  Administered 2019-02-14: 100 ug via INTRAVENOUS

## 2019-02-14 MED ORDER — EPHEDRINE 5 MG/ML INJ
INTRAVENOUS | Status: AC
  Filled 2019-02-14: qty 10

## 2019-02-14 MED ORDER — LACTATED RINGERS IR SOLN
Status: DC | PRN
  Administered 2019-02-14: 1

## 2019-02-14 MED ORDER — PHENYLEPHRINE 40 MCG/ML (10ML) SYRINGE FOR IV PUSH (FOR BLOOD PRESSURE SUPPORT)
PREFILLED_SYRINGE | INTRAVENOUS | Status: AC
  Filled 2019-02-14: qty 10

## 2019-02-14 MED ORDER — MEPERIDINE HCL 50 MG/ML IJ SOLN
6.2500 mg | INTRAMUSCULAR | Status: DC | PRN
  Administered 2019-02-14: 12.5 mg via INTRAVENOUS

## 2019-02-14 MED ORDER — FENTANYL CITRATE (PF) 250 MCG/5ML IJ SOLN
INTRAMUSCULAR | Status: AC
  Filled 2019-02-14: qty 5

## 2019-02-14 MED ORDER — EPHEDRINE SULFATE 50 MG/ML IJ SOLN
INTRAMUSCULAR | Status: DC | PRN
  Administered 2019-02-14: 5 mg via INTRAVENOUS

## 2019-02-14 MED ORDER — ROCURONIUM BROMIDE 10 MG/ML (PF) SYRINGE
PREFILLED_SYRINGE | INTRAVENOUS | Status: AC
  Filled 2019-02-14: qty 10

## 2019-02-14 MED ORDER — FENTANYL CITRATE (PF) 100 MCG/2ML IJ SOLN
INTRAMUSCULAR | Status: AC
  Filled 2019-02-14: qty 2

## 2019-02-14 MED ORDER — DEXAMETHASONE SODIUM PHOSPHATE 10 MG/ML IJ SOLN
INTRAMUSCULAR | Status: DC | PRN
  Administered 2019-02-14: 8 mg via INTRAVENOUS

## 2019-02-14 MED ORDER — SODIUM CHLORIDE 0.9 % IV BOLUS
1000.0000 mL | Freq: Once | INTRAVENOUS | Status: AC
  Administered 2019-02-14: 1000 mL via INTRAVENOUS

## 2019-02-14 MED ORDER — BACITRACIN-NEOMYCIN-POLYMYXIN 400-5-5000 EX OINT: 1.0000 "application " | TOPICAL_OINTMENT | Freq: Three times a day (TID) | CUTANEOUS | Status: DC | PRN

## 2019-02-14 MED ORDER — HYDROMORPHONE HCL 1 MG/ML IJ SOLN: 0.5000 mg | INTRAMUSCULAR | Status: DC | PRN

## 2019-02-14 MED ORDER — DEXAMETHASONE SODIUM PHOSPHATE 10 MG/ML IJ SOLN
INTRAMUSCULAR | Status: AC
  Filled 2019-02-14: qty 1

## 2019-02-14 MED ORDER — STERILE WATER FOR IRRIGATION IR SOLN
Status: DC | PRN
  Administered 2019-02-14: 1000 mL

## 2019-02-14 MED ORDER — HYDROMORPHONE HCL 1 MG/ML IJ SOLN
INTRAMUSCULAR | Status: AC
  Filled 2019-02-14: qty 2

## 2019-02-14 MED ORDER — CEFAZOLIN SODIUM-DEXTROSE 2-4 GM/100ML-% IV SOLN
2.0000 g | INTRAVENOUS | Status: AC
  Administered 2019-02-14: 2 g via INTRAVENOUS
  Filled 2019-02-14: qty 100

## 2019-02-14 MED ORDER — SODIUM CHLORIDE 0.9 % IR SOLN
3000.0000 mL | Status: DC
  Administered 2019-02-14: 3000 mL
  Administered 2019-02-14 – 2019-02-16 (×18): 3000 mL via INTRAVESICAL

## 2019-02-14 MED ORDER — SULFAMETHOXAZOLE-TRIMETHOPRIM 800-160 MG PO TABS: 1.0000 | ORAL_TABLET | Freq: Two times a day (BID) | ORAL | 0 refills | Status: DC

## 2019-02-14 MED ORDER — DOCUSATE SODIUM 100 MG PO CAPS
100.0000 mg | ORAL_CAPSULE | Freq: Two times a day (BID) | ORAL | Status: DC
  Administered 2019-02-14 – 2019-02-16 (×4): 100 mg via ORAL
  Filled 2019-02-14 (×4): qty 1

## 2019-02-14 MED ORDER — BUPIVACAINE LIPOSOME 1.3 % IJ SUSP
20.0000 mL | Freq: Once | INTRAMUSCULAR | Status: AC
  Administered 2019-02-14: 20 mL
  Filled 2019-02-14: qty 20

## 2019-02-14 MED ORDER — MIDAZOLAM HCL 2 MG/2ML IJ SOLN: 0.5000 mg | Freq: Once | INTRAMUSCULAR | Status: DC | PRN

## 2019-02-14 MED ORDER — ONDANSETRON HCL 4 MG/2ML IJ SOLN: 4.0000 mg | INTRAMUSCULAR | Status: DC | PRN

## 2019-02-14 MED ORDER — HYDROMORPHONE HCL 1 MG/ML IJ SOLN
0.2500 mg | INTRAMUSCULAR | Status: DC | PRN
  Administered 2019-02-14 (×3): 0.5 mg via INTRAVENOUS

## 2019-02-14 MED ORDER — HYDROCODONE-ACETAMINOPHEN 5-325 MG PO TABS: 1.0000 | ORAL_TABLET | Freq: Four times a day (QID) | ORAL | 0 refills | Status: DC | PRN

## 2019-02-14 MED ORDER — MEPERIDINE HCL 50 MG/ML IJ SOLN
INTRAMUSCULAR | Status: AC
  Filled 2019-02-14: qty 1

## 2019-02-14 MED ORDER — PROPOFOL 10 MG/ML IV BOLUS
INTRAVENOUS | Status: AC
  Filled 2019-02-14: qty 20

## 2019-02-14 MED ORDER — ACETAMINOPHEN 325 MG PO TABS: 650.0000 mg | ORAL_TABLET | ORAL | Status: DC | PRN

## 2019-02-14 MED ORDER — SODIUM CHLORIDE (PF) 0.9 % IJ SOLN
INTRAMUSCULAR | Status: AC
  Filled 2019-02-14: qty 20

## 2019-02-14 MED ORDER — SCOPOLAMINE 1 MG/3DAYS TD PT72
1.0000 | MEDICATED_PATCH | Freq: Once | TRANSDERMAL | Status: DC
  Administered 2019-02-14: 1.5 mg via TRANSDERMAL
  Filled 2019-02-14: qty 1

## 2019-02-14 MED ORDER — LIDOCAINE 2% (20 MG/ML) 5 ML SYRINGE
INTRAMUSCULAR | Status: AC
  Filled 2019-02-14: qty 5

## 2019-02-14 MED ORDER — SUCCINYLCHOLINE CHLORIDE 200 MG/10ML IV SOSY
PREFILLED_SYRINGE | INTRAVENOUS | Status: AC
  Filled 2019-02-14: qty 10

## 2019-02-14 MED ORDER — DIPHENHYDRAMINE HCL 12.5 MG/5ML PO ELIX: 12.5000 mg | ORAL_SOLUTION | Freq: Four times a day (QID) | ORAL | Status: DC | PRN

## 2019-02-14 MED ORDER — LIDOCAINE HCL (CARDIAC) PF 100 MG/5ML IV SOSY
PREFILLED_SYRINGE | INTRAVENOUS | Status: DC | PRN
  Administered 2019-02-14: 30 mg via INTRAVENOUS

## 2019-02-14 MED ORDER — SUCCINYLCHOLINE CHLORIDE 20 MG/ML IJ SOLN
INTRAMUSCULAR | Status: DC | PRN
  Administered 2019-02-14: 120 mg via INTRAVENOUS

## 2019-02-14 MED ORDER — DIPHENHYDRAMINE HCL 50 MG/ML IJ SOLN: 12.5000 mg | Freq: Four times a day (QID) | INTRAMUSCULAR | Status: DC | PRN

## 2019-02-14 MED ORDER — LACTATED RINGERS IV SOLN
INTRAVENOUS | Status: DC
  Administered 2019-02-14 (×3): via INTRAVENOUS

## 2019-02-14 MED ORDER — BELLADONNA ALKALOIDS-OPIUM 16.2-60 MG RE SUPP
1.0000 | Freq: Four times a day (QID) | RECTAL | Status: DC | PRN
  Administered 2019-02-14 – 2019-02-15 (×3): 1 via RECTAL
  Filled 2019-02-14 (×3): qty 1

## 2019-02-14 MED ORDER — SUGAMMADEX SODIUM 200 MG/2ML IV SOLN
INTRAVENOUS | Status: AC
  Filled 2019-02-14: qty 2

## 2019-02-14 MED ORDER — ONDANSETRON HCL 4 MG/2ML IJ SOLN
INTRAMUSCULAR | Status: DC | PRN
  Administered 2019-02-14: 4 mg via INTRAVENOUS

## 2019-02-14 MED ORDER — ONDANSETRON HCL 4 MG/2ML IJ SOLN
INTRAMUSCULAR | Status: AC
  Filled 2019-02-14: qty 2

## 2019-02-14 MED ORDER — LEVOTHYROXINE SODIUM 25 MCG PO TABS
125.0000 ug | ORAL_TABLET | Freq: Every day | ORAL | Status: DC
  Administered 2019-02-15 – 2019-02-16 (×2): 125 ug via ORAL
  Filled 2019-02-14 (×2): qty 1

## 2019-02-14 SURGICAL SUPPLY — 57 items
APPLICATOR COTTON TIP 6 STRL (MISCELLANEOUS) ×1 IMPLANT
APPLICATOR COTTON TIP 6IN STRL (MISCELLANEOUS) ×2
CATH FOLEY 2WAY SLVR  5CC 18FR (CATHETERS) ×1
CATH FOLEY 2WAY SLVR 5CC 18FR (CATHETERS) ×1 IMPLANT
CATH FOLEY 3WAY 30CC 22FR (CATHETERS) ×2 IMPLANT
CHLORAPREP W/TINT 26 (MISCELLANEOUS) ×2 IMPLANT
CLIP VESOLOCK MED LG 6/CT (CLIP) ×2 IMPLANT
CLOTH BEACON ORANGE TIMEOUT ST (SAFETY) ×2 IMPLANT
COVER SURGICAL LIGHT HANDLE (MISCELLANEOUS) ×2 IMPLANT
COVER TIP SHEARS 8 DVNC (MISCELLANEOUS) ×1 IMPLANT
COVER TIP SHEARS 8MM DA VINCI (MISCELLANEOUS) ×1
COVER WAND RF STERILE (DRAPES) IMPLANT
DECANTER SPIKE VIAL GLASS SM (MISCELLANEOUS) ×2 IMPLANT
DERMABOND ADVANCED (GAUZE/BANDAGES/DRESSINGS) ×1
DERMABOND ADVANCED .7 DNX12 (GAUZE/BANDAGES/DRESSINGS) ×1 IMPLANT
DRAIN CHANNEL RND F F (WOUND CARE) IMPLANT
DRAPE ARM DVNC X/XI (DISPOSABLE) ×4 IMPLANT
DRAPE COLUMN DVNC XI (DISPOSABLE) ×1 IMPLANT
DRAPE DA VINCI XI ARM (DISPOSABLE) ×4
DRAPE DA VINCI XI COLUMN (DISPOSABLE) ×1
DRAPE SURG IRRIG POUCH 19X23 (DRAPES) ×2 IMPLANT
DRSG TEGADERM 4X4.75 (GAUZE/BANDAGES/DRESSINGS) ×2 IMPLANT
ELECT REM PT RETURN 15FT ADLT (MISCELLANEOUS) ×2 IMPLANT
GAUZE 4X4 16PLY RFD (DISPOSABLE) IMPLANT
GAUZE SPONGE 2X2 8PLY STRL LF (GAUZE/BANDAGES/DRESSINGS) ×1 IMPLANT
GLOVE BIO SURGEON STRL SZ 6.5 (GLOVE) ×2 IMPLANT
GLOVE BIO SURGEON STRL SZ8 (GLOVE) ×4 IMPLANT
GLOVE BIOGEL PI IND STRL 8 (GLOVE) ×2 IMPLANT
GLOVE BIOGEL PI INDICATOR 8 (GLOVE) ×2
GOWN STRL REUS W/TWL LRG LVL3 (GOWN DISPOSABLE) ×6 IMPLANT
HOLDER FOLEY CATH W/STRAP (MISCELLANEOUS) ×2 IMPLANT
IRRIG SUCT STRYKERFLOW 2 WTIP (MISCELLANEOUS) ×2
IRRIGATION SUCT STRKRFLW 2 WTP (MISCELLANEOUS) ×1 IMPLANT
IV LACTATED RINGERS 1000ML (IV SOLUTION) ×2 IMPLANT
KIT TURNOVER KIT A (KITS) IMPLANT
NEEDLE INSUFFLATION 14GA 120MM (NEEDLE) ×2 IMPLANT
PACK ROBOT UROLOGY CUSTOM (CUSTOM PROCEDURE TRAY) ×2 IMPLANT
PAD POSITIONING PINK XL (MISCELLANEOUS) ×2 IMPLANT
SEAL CANN UNIV 5-8 DVNC XI (MISCELLANEOUS) ×4 IMPLANT
SEAL XI 5MM-8MM UNIVERSAL (MISCELLANEOUS) ×4
SET IRRIG Y TYPE TUR BLADDER L (SET/KITS/TRAYS/PACK) IMPLANT
SOLUTION ELECTROLUBE (MISCELLANEOUS) ×2 IMPLANT
SPONGE GAUZE 2X2 STER 10/PKG (GAUZE/BANDAGES/DRESSINGS) ×1
SPONGE LAP 4X18 RFD (DISPOSABLE) IMPLANT
SUT ETHILON 3 0 PS 1 (SUTURE) ×2 IMPLANT
SUT MNCRL AB 4-0 PS2 18 (SUTURE) ×4 IMPLANT
SUT V-LOC BARB 180 2/0GR6 GS22 (SUTURE) ×4
SUT VIC AB 0 CT1 27 (SUTURE) ×4
SUT VIC AB 0 CT1 27XBRD ANTBC (SUTURE) ×4 IMPLANT
SUT VIC AB 2-0 SH 27 (SUTURE) ×2
SUT VIC AB 2-0 SH 27X BRD (SUTURE) ×2 IMPLANT
SUT VICRYL 0 UR6 27IN ABS (SUTURE) ×2 IMPLANT
SUT VLOC BARB 180 ABS3/0GR12 (SUTURE)
SUTURE V-LC BRB 180 2/0GR6GS22 (SUTURE) ×2 IMPLANT
SUTURE VLOC BRB 180 ABS3/0GR12 (SUTURE) IMPLANT
TOWEL OR NON WOVEN STRL DISP B (DISPOSABLE) ×2 IMPLANT
WATER STERILE IRR 1000ML POUR (IV SOLUTION) ×2 IMPLANT

## 2019-02-14 NOTE — H&P (Signed)
Urology Admission H&P  Chief Complaint: urinary retention  History of Present Illness: Hector Kelley is a 73yo with a history of BPH with urinary retention. He is currently on flomax BID and finasteride and has to perform CIC to empty his bladder. He has a 150g prostate on [place]. He denies any fevers/chills/sweats. No nausea/vomiting.   Past Medical History:  Diagnosis Date  . Arthritis   . BPH with obstruction/lower urinary tract symptoms   . Complication of anesthesia    urine retention, requiring catheter after anesthesia  . Depression   . Diverticulosis   . H/O clavicle fracture    Right  . Hypothyroidism   . Wears contact lenses    Past Surgical History:  Procedure Laterality Date  . BACK SURGERY  1980   Lumbar Laminectomy   . CHOLECYSTECTOMY    . CIRCUMCISION    . COLONOSCOPY    . KNEE ARTHROSCOPY Left    2016  . KNEE ARTHROSCOPY AND ARTHROTOMY Right 1980    Home Medications:  Current Facility-Administered Medications  Medication Dose Route Frequency Provider Last Rate Last Dose  . bupivacaine liposome (EXPAREL) 1.3 % injection 266 mg  20 mL Infiltration Once Cleon Gustin, MD      . ceFAZolin (ANCEF) IVPB 2g/100 mL premix  2 g Intravenous 30 min Pre-Op Alyson Ingles Candee Furbish, MD      . lactated ringers infusion   Intravenous Continuous Annye Asa, MD 20 mL/hr at 02/14/19 [phone]    . scopolamine (TRANSDERM-SCOP) 1 MG/3DAYS 1.5 mg  1 patch Transdermal Once Annye Asa, MD   1.5 mg at 02/14/19 4562   Allergies: No Known Allergies  History reviewed. No pertinent family history. Social History:  reports that he has never smoked. He has never used smokeless tobacco. He reports that he does not drink alcohol or use drugs.  Review of Systems  Genitourinary: Positive for frequency and urgency.  All other systems reviewed and are negative.   Physical Exam:  Vital signs in last 24 hours: Temp:  [97.7 F (36.5 C)] 97.7 F (36.5 C) (05/28 0611) Pulse Rate:  [60]  60 (05/28 0611) Resp:  [18] 18 (05/28 0611) BP: (132)/(86) 132/86 (05/28 0611) SpO2:  [97 %] 97 % (05/28 0611) Weight:  [95 kg] 95 kg (05/28 5638) Physical Exam  Constitutional: He is oriented to person, place, and time. He appears well-developed and well-nourished.  HENT:  Head: Normocephalic and atraumatic.  Eyes: Pupils are equal, round, and reactive to light. EOM are normal.  Neck: Normal range of motion. No thyromegaly present.  Cardiovascular: Normal rate and regular rhythm.  Respiratory: Effort normal. No respiratory distress.  GI: Soft. He exhibits no distension.  Musculoskeletal: Normal range of motion.        General: No edema.  Neurological: He is alert and oriented to person, place, and time.  Skin: Skin is warm and dry.  Psychiatric: He has a normal mood and affect. His behavior is normal. Judgment and thought content normal.    Laboratory Data:  No results found for this or any previous visit (from the past 24 hour(s)). Recent Results (from the past 240 hour(s))  Novel Coronavirus, NAA (hospital order; send-out to ref lab)     Status: None   Collection Time: 02/08/19  9:32 AM  Result Value Ref Range Status   SARS-CoV-2, NAA NOT DETECTED NOT DETECTED Final    Comment: (NOTE) Testing was performed using the cobas(R) SARS-CoV-2 test. This test was developed and its performance characteristics  determined by [employer]. This test has not been FDA cleared or approved. This test has been authorized by FDA under an Emergency Use Authorization (EUA). This test is only authorized for the duration of time the declaration that circumstances exist justifying the authorization of the emergency use of in vitro diagnostic tests for detection of SARS-CoV-2 virus and/or diagnosis of COVID-19 infection under section 564(b)(1) of the Act, 21 U.S.C. 549IYM-4(B)(5), unless the authorization is terminated or revoked sooner. When diagnostic testing is negative, the possibility  of a false negative result should be considered in the context of a patient's recent exposures and the presence of clinical signs and symptoms consistent with COVID-19. An individual without symptoms of COVID-19 and who is not shedding SARS-CoV-2 virus would expect to have  a negative (not detected) result in this assay. Performed At: [facility] [address] [place], [place] [id number] Rush Farmer MD GS:[id number]    [place]  Final    Comment: Performed at [facility], [place] [address], [place], [place] [address]   Creatinine: No results for input(s): CREATININE in the last 168 hours. Baseline Creatinine: unknown  Impression/Assessment:  72yo with BPH with urinary retention  Plan:  The risks/benefits/alternatives to robotic simple prostatectomy was explained to the patient and he understands and wishes to proceed with surgery  Nicolette Bang 02/14/2019, 7:30 AM

## 2019-02-14 NOTE — Anesthesia Procedure Notes (Signed)
Procedure Name: Intubation Date/Time: 02/14/2019 7:47 AM Performed by: Glory Buff, CRNA Pre-anesthesia Checklist: Patient identified, Emergency Drugs available, Suction available and Patient being monitored Patient Re-evaluated:Patient Re-evaluated prior to induction Oxygen Delivery Method: Circle system utilized Preoxygenation: Pre-oxygenation with 100% oxygen Induction Type: IV induction Ventilation: Mask ventilation without difficulty Laryngoscope Size: Miller and 3 Grade View: Grade I Tube type: Oral Tube size: 7.5 mm Number of attempts: 1 Airway Equipment and Method: Stylet and Oral airway Placement Confirmation: ETT inserted through vocal cords under direct vision,  positive ETCO2 and breath sounds checked- equal and bilateral Secured at: 22 cm Tube secured with: Tape Dental Injury: Teeth and Oropharynx as per pre-operative assessment

## 2019-02-14 NOTE — Progress Notes (Signed)
Gentle hand irrigation performed with 80cc NS.  Moderate amount of small clots evacuated. Reconnected to CBI at fast rate, foley catheter draining red/pink urine. Stacey Drain

## 2019-02-14 NOTE — Plan of Care (Signed)
  Problem: Education: Goal: Knowledge of the procedure and recovery process will improve Outcome: Progressing   

## 2019-02-14 NOTE — Discharge Instructions (Signed)

## 2019-02-14 NOTE — Progress Notes (Signed)
First attempt at ambulation, pt stood at bedside became weak & nauseous. Vital Signs stable. Assisted back to bed, will re-attempt to ambulate later this evening. Will report to on-coming RN. Stacey Drain

## 2019-02-14 NOTE — Anesthesia Postprocedure Evaluation (Signed)
Anesthesia Post Note  Patient: Hector Kelley  Procedure(s) Performed: XI ROBOTIC ASSISTED SIMPLE PROSTATECTOMY (N/A )     Patient location during evaluation: PACU Anesthesia Type: General Level of consciousness: patient cooperative, oriented and sedated Pain management: pain level controlled Vital Signs Assessment: post-procedure vital signs reviewed and stable Respiratory status: spontaneous breathing, nonlabored ventilation, respiratory function stable and patient connected to nasal cannula oxygen Cardiovascular status: blood pressure returned to baseline and stable Postop Assessment: no apparent nausea or vomiting Anesthetic complications: no    Last Vitals:  Vitals:   02/14/19 1245 02/14/19 1319  BP: 134/83 136/75  Pulse: 63 (!) 55  Resp: 16 16  Temp: (!) 36.4 C (!) 36.4 C  SpO2: 98% 98%    Last Pain:  Vitals:   02/14/19 1330  TempSrc:   PainSc: 3                  Luther Newhouse,E. Janna Oak

## 2019-02-14 NOTE — Op Note (Signed)
PREOPERATIVE DIAGNOSIS: BPH with urinary retention  POSTOPERATIVE DIAGNOSIS: Same  PROCEDURES: 1. Robotic-assisted laparoscopic simple prostatectomy.  ANESTHESIA: General  ASSISTANT: Clemetine Marker, PA  ESTIMATED BLOOD LOSS: 500 mL.  COMPLICATIONS: None.  SPECIMEN: 1.prostatic adenoma  ANTIBIOTICS: ancef  FINDINGS: 1cm intravesical prostatic protrusion. Ureteral orifices in normal anatomic location. No leaks from cystotomy at 150cc of water.  The assistant was utilized for retraction, passing suture, and retraction.  DRAINS: 1. Jackson-Pratt drain to bulb suction. 2. Foley catheter to straight drain.  INDICATION: Mr Makara is a very pleasant 73 year old gentleman, who has BPH with significant LUTS including elevated PVR and performs CIC. His TRUS volume is 150cc.  Options were discussed with the patient in detail for primary manage including continued surveillance protocols versus surgical extirpation with and without minimally invasive assistance and he wished to proceed with robotic simple prostatectomy. Informed consent was obtained and placed in the medical record.  PROCEDURE IN DETAIL: The patient was brought to the operating room and a breif timeout was down to ensure correct patient, correct procedure, and correct site. Intravenous antibiotics were administered. General endotracheal anesthesia was introduced. The patient was placed into a low lithotomy position after tucking his arms with foam padding, placing on a pink and non-slide foam pad. A test of steep Trendelenburg positioning was performed and he was found to be suitably positioned. Sterile field was created by prepping and draping the patient's penis, perineum and proximal thighs using iodine and his infra-xiphoid abdomen using chlorhexidine gluconate. Next, a high-flow, low-pressure pneumoperitoneum was obtained using Veress technique in the infraumbilical midline having passed the aspiration and  drop test. Next, a 8-mm robotic camera port was placed in the same location. Laparoscopic examination of the peritoneal cavity revealed no significant adhesions and no visceral injury. Additional ports were placed as follows: Right paramedian 8-mm robotic port, right far lateral 12-mm assistant port, left paramedian 8-mm robotic port, left far lateral 8-mm robotic port, and right paramedian 5-mm suction port. Robot was docked and passed through the electronic checks. Next, attention was directed for the development of space of Retzius. Incision was made lateral to the left medial umbilical ligament from the midline towards the area of the internal ring and coursing along the iliac vessels towards the area of the ureter, which was positively identified. The left bladder was dissected away from the pelvic sidewall towards the area of the endopelvic fascia. A mirror-imaged dissection was performed on the right side.  Additional anterior attachments were taken down using cautery scissors. Next, the bladder neck was identified moving the Foley catheter back and forth. We then made a 6cm transverse cystotomy 3cm from the bladder neck. We identified the ureteral orifices and care was taken to exclude then from the dissection. We then placed 3 holding stitches in the anterior bladder wall and secured it to Coopers ligament.  We then made a circumscribing incision around the base of the prostate. We then used a 0 vicryl in a figure of eight fashion in the base of the adenoma for traction. We proceeded with a posterior dissection of the prostate until we identified the capsule. We then used a combination of electrocautery, blunt and sharp dissection to free the adenoma from the capsule. We then dissected laterally to the apex. Individual bleeders were cauterized. We then dissected anteriorly along the capsule until we reached the apex. The adenoma was then freed and placed in an endocatch bag. We noted  good hemostasis and no additional sutures were placed. A Foley  catheter was then placed per urethra easily. We then tacked down the bladder neck to the prostatic fossa with a single interrupted 2-0 vicryl. We then proceeded to closed the cystotomy. We closed the bladder with a running 0 vicryl full thickness. We then performed a imbricating second layer  Closure with 0 vicryl. The bladder was then filled with 150cc of water and we noted no leak.  All sponge and needle counts were correct. A closed suction drain was brought to the previous left lateral robotic port site into the area of the peritoneal cavity. The previous right 12-mm assistant port was closed at the level of the fascia using a Carter-Thomason suture passer under laparoscopic vision. Robot was undocked. Specimen was retrieved by extending the previous camera port site inferiorly for distance approximately 3 cm and removing the prostatectomy specimens and setting aside for permanent pathology. The site was closed at the level of fascia using figure-of-eight 0 vicryl followed by reapproximation of the Scarpa's using running Vicryl. All incision sites were infiltrated with dilute Lyophilized Marcaine and closed at the level of the skin using subcuticular Monocryl followed by Dermabond. Procedure was then terminated. The patient tolerated the procedure well. There were no immediate periprocedural complications and the patient was taken to the postanesthesia care unit in stable Condition.  COMPLICATIONS: None  CONDITION: Stable, extubated, transferred to PACU  PLAN: The patient will be admitted for 1-2 for hydration, post operative monitoring and pain control. He will be discharged home with foley in place and foley will be removed in 14 days, He will have a cystogram prior to foley catheter removal

## 2019-02-14 NOTE — Transfer of Care (Signed)
Immediate Anesthesia Transfer of Care Note  Patient: Hector Kelley  Procedure(s) Performed: XI ROBOTIC ASSISTED SIMPLE PROSTATECTOMY (N/A )  Patient Location: PACU  Anesthesia Type:General  Level of Consciousness: drowsy, patient cooperative and responds to stimulation  Airway & Oxygen Therapy: Patient Spontanous Breathing and Patient connected to face mask oxygen  Post-op Assessment: Report given to RN and Post -op Vital signs reviewed and stable  Post vital signs: Reviewed and stable  Last Vitals:  Vitals Value Taken Time  BP 123/81 02/14/2019 11:06 AM  Temp    Pulse 88 02/14/2019 11:08 AM  Resp 15 02/14/2019 11:08 AM  SpO2 95 % 02/14/2019 11:08 AM  Vitals shown include unvalidated device data.  Last Pain:  Vitals:   02/14/19 0611  TempSrc: Oral      Patients Stated Pain Goal: 3 (53/20/23 3435)  Complications: No apparent anesthesia complications

## 2019-02-15 ENCOUNTER — Encounter (HOSPITAL_COMMUNITY): Payer: Self-pay | Admitting: Urology

## 2019-02-15 LAB — BASIC METABOLIC PANEL
Anion gap: 4 — ABNORMAL LOW (ref 5–15)
BUN: 11 mg/dL (ref 8–23)
CO2: 26 mmol/L (ref 22–32)
Calcium: 8.4 mg/dL — ABNORMAL LOW (ref 8.9–10.3)
Chloride: 109 mmol/L (ref 98–111)
Creatinine, Ser: 0.91 mg/dL (ref 0.61–1.24)
GFR calc Af Amer: >60 mL/min (ref >60)
GFR calc non Af Amer: >60 mL/min (ref >60)
Glucose, Bld: 127 mg/dL — ABNORMAL HIGH (ref 70–99)
Potassium: 3.9 mmol/L (ref 3.5–5.1)
Sodium: 139 mmol/L (ref 135–145)

## 2019-02-15 LAB — HEMOGLOBIN AND HEMATOCRIT, BLOOD
HCT: 37.4 % — ABNORMAL LOW (ref 39.0–52.0)
Hemoglobin: 11.8 g/dL — ABNORMAL LOW (ref 13.0–17.0)

## 2019-02-15 NOTE — Care Management Obs Status (Signed)
[facility] NOTIFICATION   Patient Details  Name: Capers Hagmann MRN: [id number] Date of Birth: 17-Oct-1945   Medicare Observation Status Notification Given:  Yes    MahabirJuliann Pulse, RN 02/15/2019, 3:11 PM

## 2019-02-15 NOTE — Care Management CC44 (Signed)
Condition Code 44 Documentation Completed  Patient Details  Name: Hector Kelley MRN: [id number] Date of Birth: [birth year]   Condition Code 44 given:  Yes Patient signature on Condition Code 44 notice:  Yes Documentation of 2 MD's agreement:  Yes Code 44 added to claim:  Yes    Dessa Phi, RN 02/15/2019, 3:10 PM

## 2019-02-15 NOTE — Progress Notes (Signed)
1 Day Post-Op Subjective: Patient reports mild incision pain which is controlled with pain medication. No nausea. Negative flatus. Hemoglobin stable. JP 675 output. Urine light pink on CBI  Objective: Vital signs in last 24 hours: Temp:  [98.2 F (36.8 C)-98.6 F (37 C)] 98.4 F (36.9 C) (05/29 1343) Pulse Rate:  [51-61] 52 (05/29 1343) Resp:  [14-18] 14 (05/29 1343) BP: (121-146)/(53-90) 123/53 (05/29 1343) SpO2:  [95 %-100 %] 97 % (05/29 1343)  Intake/Output from previous day: 05/28 0701 - 05/29 0700 In: 32543.9 [P.O.:60; I.V.:2868.9; IV Piggyback:1100] Out: 40800 [Urine:39625; Drains:675; Blood:500] Intake/Output this shift: Total I/O In: 9740 [P.O.:660; Other:9080] Out: 13795 [ZOXWR:60454; Drains:45]  Physical Exam:  General:alert, cooperative and appears stated age GI: soft and tenderness: suprapubic Male genitalia: not done Extremities: extremities normal, atraumatic, no cyanosis or edema  Lab Results: Recent Labs    02/14/19 1110 02/15/19 0532  HGB 13.7 11.8*  HCT 42.9 37.4*   BMET Recent Labs    02/15/19 0532  NA 139  K 3.9  CL 109  CO2 26  GLUCOSE 127*  BUN 11  CREATININE 0.91  CALCIUM 8.4*   No results for input(s): LABPT, INR in the last 72 hours. No results for input(s): LABURIN in the last 72 hours. Results for orders placed or performed during the hospital encounter of 02/08/19  Novel Coronavirus, NAA (hospital order; send-out to ref lab)     Status: None   Collection Time: 02/08/19  9:32 AM  Result Value Ref Range Status   SARS-CoV-2, NAA NOT DETECTED NOT DETECTED Final    Comment: (NOTE) Testing was performed using the cobas(R) SARS-CoV-2 test. This test was developed and its performance characteristics determined by [employer]. This test has not been FDA cleared or approved. This test has been authorized by FDA under an Emergency Use Authorization (EUA). This test is only authorized for the duration of time the declaration  that circumstances exist justifying the authorization of the emergency use of in vitro diagnostic tests for detection of SARS-CoV-2 virus and/or diagnosis of COVID-19 infection under section 564(b)(1) of the Act, 21 U.S.C. 098JXB-1(Y)(7), unless the authorization is terminated or revoked sooner. When diagnostic testing is negative, the possibility of a false negative result should be considered in the context of a patient's recent exposures and the presence of clinical signs and symptoms consistent with COVID-19. An individual without symptoms of COVID-19 and who is not shedding SARS-CoV-2 virus would expect to have  a negative (not detected) result in this assay. Performed At: [facility] [address] [place], [place] [id number] Rush Farmer MD QM:[id number]    [place]  Final    Comment: Performed at [facility], [place] [address] [place],  [address]    Studies/Results: No results found.  Assessment/Plan: POD#1 robotic simple prostatectomy  1. Advance diet to regular 2. Wean CBI to off 3. Saline lock IVF 4. Ambulate in halls with assistance   LOS: 1 day   Hector Kelley 02/15/2019, 4:07 PM

## 2019-02-15 NOTE — Progress Notes (Signed)
Patient's foley catheter with continuous bladder irrigation at fast rate. RN hand irrigated clots several times throughout night and morning. Urine now starting to lighten to light pink/dark yellow. JP out-put slowing. Patient ambulated to bathroom and back to bed with walker and 2 assist. VSS stable.

## 2019-02-16 LAB — CREATININE, FLUID (PLEURAL, PERITONEAL, JP DRAINAGE): Creat, Fluid: 0.9 mg/dL

## 2019-02-16 NOTE — Progress Notes (Signed)
Pt had episode of nausea and a small amount of emesis this afternoon. Dr. Lovena Neighbours notified. Pt continues to be slightly agitated because he wants to be discharged. Spoke with pt's wife and she  is also adamant that pt be discharged to home. Wife  is retired Therapist, sports and states that she is comfortable providing care for pt.  I removed JP drain without any difficulty and pt tolerated well.

## 2019-02-16 NOTE — Progress Notes (Signed)
2 Days Post-Op Subjective: The patient was confused and agitated overnight.  He states that he saw a snake on the ceiling that he could move with his eyes last night.  He is still somewhat agitated this morning, but alert and oriented x3.  No labs were ordered this a.m.  He was afebrile overnight.  He states that his pain is controlled and he denies nausea/vomiting or fever/chills.  He is tolerating a regular diet.  CBI has run steadily overnight.  JP output appropriate.  Objective: Vital signs in last 24 hours: Temp:  [98 F (36.7 C)-98.6 F (37 C)] 98.6 F (37 C) (05/30 0458) Pulse Rate:  [51-57] 55 (05/30 0458) Resp:  [14-16] 16 (05/30 0458) BP: (123-152)/(53-91) 152/91 (05/30 0458) SpO2:  [95 %-100 %] 98 % (05/30 0458)  Intake/Output from previous day: 05/29 0701 - 05/30 0700 In: 22820 [P.O.:840; I.V.:900] Out: 26230 [Urine:26100; Drains:130]  Intake/Output this shift: Total I/O In: -  Out: 517 [Urine:600; Drains:75]  Physical Exam:  General: Alert and oriented but agitated CV: RRR, palpable distal pulses Lungs: CTAB, equal chest rise Abdomen: Soft, NTND, no rebound or guarding Incisions: Clean, dry and intact.  JP drain with serosanguineous output Gu: Foley catheter in place and draining slightly red urine with a moderate amount of CBI Ext: NT, No erythema  Lab Results: Recent Labs    02/14/19 1110 02/15/19 0532  HGB 13.7 11.8*  HCT 42.9 37.4*   BMET Recent Labs    02/15/19 0532  NA 139  K 3.9  CL 109  CO2 26  GLUCOSE 127*  BUN 11  CREATININE 0.91  CALCIUM 8.4*     Studies/Results: No results found.  Assessment/Plan: POD s/p robotic simple prostatectomy  -Continue to orient the patient and monitor his mental status -JP creatinine pending -Titrate CBI as needed -Out of bed to chair and ambulate -Continue regular diet -Will continue to monitor   LOS: 1 day   Ellison Hughs, MD [facility] Pager: [phone]  02/16/2019, 9:42 AM

## 2019-02-20 NOTE — Discharge Summary (Signed)
Date of admission: 02/14/2019  Date of discharge: 02/20/2019  Admission diagnosis: BPH with LUTS and urinary retention  Discharge diagnosis: Same  Procedures:  Robotic simple prostatectomy  History and Physical: For full details, please see admission history and physical. Briefly, Hector Kelley is a 73 y.o. year old patient with BPH secondary to a markedly enlarged prostate requiring CIC.   Hospital Course: The patient was monitored on the floor post-op.  On POD1, the patient demanded to go home despite hallucinations.  He was A/Ox3 with no significant neurologic deficits.  He refused to have any additional evaluation/observation in the hospital.   Physical Exam:  General: Alert and oriented CV: RRR, palpable distal pulses Lungs: CTAB, equal chest rise Abdomen: Soft, NTND, no rebound or guarding Incisions: c/d/i GU:  Foley draining yellow urine  Ext: NT, No erythema  Laboratory values: No results for input(s): HGB, HCT in the last 72 hours. No results for input(s): CREATININE in the last 72 hours.  Disposition: Home  Discharge instruction: The patient was instructed to be ambulatory but told to refrain from heavy lifting, strenuous activity, or driving.  Discharge medications:  Allergies as of 02/16/2019   No Known Allergies     Medication List    STOP taking these medications   aspirin EC 81 MG tablet   cholecalciferol 25 MCG (1000 UT) tablet Commonly known as:  VITAMIN D3   GLUCOSAMINE CHOND MSM FORMULA PO   ibuprofen 200 MG tablet Commonly known as:  ADVIL   tamsulosin 0.4 MG Caps capsule Commonly known as:  FLOMAX     TAKE these medications   ARTIFICIAL TEARS OP Place 1 drop into both eyes daily as needed (dry eyes).   cetirizine 10 MG tablet Commonly known as:  ZYRTEC Take 10 mg by mouth daily as needed for allergies.   finasteride 5 MG tablet Commonly known as:  PROSCAR Take 5 mg by mouth daily after supper.   HYDROcodone-acetaminophen 5-325 MG  tablet Commonly known as:  Norco Take 1-2 tablets by mouth every 6 (six) hours as needed for moderate pain or severe pain.   levothyroxine 125 MCG tablet Commonly known as:  SYNTHROID Take 125 mcg by mouth daily before breakfast.   sulfamethoxazole-trimethoprim 800-160 MG tablet Commonly known as:  BACTRIM DS Take 1 tablet by mouth 2 (two) times daily. Start the day prior to foley removal appointment       Followup:  Follow-up Information    McKenzie, Candee Furbish, MD On 02/28/2019.   Specialty:  Urology Why:  at 11:00 Contact information: [address] [place] [address] [phone]

## 2019-02-21 DIAGNOSIS — R338 Other retention of urine: Secondary | ICD-10-CM | POA: Diagnosis not present

## 2019-02-28 DIAGNOSIS — N401 Enlarged prostate with lower urinary tract symptoms: Secondary | ICD-10-CM | POA: Diagnosis not present

## 2019-02-28 DIAGNOSIS — R338 Other retention of urine: Secondary | ICD-10-CM | POA: Diagnosis not present

## 2019-03-13 DIAGNOSIS — R300 Dysuria: Secondary | ICD-10-CM | POA: Diagnosis not present

## 2019-03-13 DIAGNOSIS — Z6826 Body mass index (BMI) 26.0-26.9, adult: Secondary | ICD-10-CM | POA: Diagnosis not present

## 2019-04-04 DIAGNOSIS — R3915 Urgency of urination: Secondary | ICD-10-CM | POA: Diagnosis not present

## 2019-04-04 DIAGNOSIS — N401 Enlarged prostate with lower urinary tract symptoms: Secondary | ICD-10-CM | POA: Diagnosis not present

## 2019-04-04 DIAGNOSIS — N5201 Erectile dysfunction due to arterial insufficiency: Secondary | ICD-10-CM | POA: Diagnosis not present

## 2019-06-19 DIAGNOSIS — E7800 Pure hypercholesterolemia, unspecified: Secondary | ICD-10-CM | POA: Diagnosis not present

## 2019-06-19 DIAGNOSIS — E039 Hypothyroidism, unspecified: Secondary | ICD-10-CM | POA: Diagnosis not present

## 2019-07-11 DIAGNOSIS — R69 Illness, unspecified: Secondary | ICD-10-CM | POA: Diagnosis not present

## 2019-09-17 DIAGNOSIS — N401 Enlarged prostate with lower urinary tract symptoms: Secondary | ICD-10-CM | POA: Diagnosis not present

## 2019-09-19 DIAGNOSIS — I10 Essential (primary) hypertension: Secondary | ICD-10-CM | POA: Diagnosis not present

## 2019-09-19 DIAGNOSIS — E782 Mixed hyperlipidemia: Secondary | ICD-10-CM | POA: Diagnosis not present

## 2019-10-04 DIAGNOSIS — N5201 Erectile dysfunction due to arterial insufficiency: Secondary | ICD-10-CM | POA: Diagnosis not present

## 2019-10-04 DIAGNOSIS — N401 Enlarged prostate with lower urinary tract symptoms: Secondary | ICD-10-CM | POA: Diagnosis not present

## 2019-10-04 DIAGNOSIS — R3915 Urgency of urination: Secondary | ICD-10-CM | POA: Diagnosis not present

## 2019-10-16 DIAGNOSIS — Z23 Encounter for immunization: Secondary | ICD-10-CM | POA: Diagnosis not present

## 2019-10-18 DIAGNOSIS — E7849 Other hyperlipidemia: Secondary | ICD-10-CM | POA: Diagnosis not present

## 2019-10-18 DIAGNOSIS — I10 Essential (primary) hypertension: Secondary | ICD-10-CM | POA: Diagnosis not present

## 2019-10-21 DIAGNOSIS — E782 Mixed hyperlipidemia: Secondary | ICD-10-CM | POA: Diagnosis not present

## 2019-10-21 DIAGNOSIS — N401 Enlarged prostate with lower urinary tract symptoms: Secondary | ICD-10-CM | POA: Diagnosis not present

## 2019-10-21 DIAGNOSIS — Z9079 Acquired absence of other genital organ(s): Secondary | ICD-10-CM | POA: Diagnosis not present

## 2019-10-21 DIAGNOSIS — Z0001 Encounter for general adult medical examination with abnormal findings: Secondary | ICD-10-CM | POA: Diagnosis not present

## 2019-10-21 DIAGNOSIS — E039 Hypothyroidism, unspecified: Secondary | ICD-10-CM | POA: Diagnosis not present

## 2019-10-24 DIAGNOSIS — E782 Mixed hyperlipidemia: Secondary | ICD-10-CM | POA: Diagnosis not present

## 2019-10-24 DIAGNOSIS — E039 Hypothyroidism, unspecified: Secondary | ICD-10-CM | POA: Diagnosis not present

## 2019-10-24 DIAGNOSIS — L0100 Impetigo, unspecified: Secondary | ICD-10-CM | POA: Diagnosis not present

## 2019-10-24 DIAGNOSIS — Z6828 Body mass index (BMI) 28.0-28.9, adult: Secondary | ICD-10-CM | POA: Diagnosis not present

## 2019-10-24 DIAGNOSIS — Z0001 Encounter for general adult medical examination with abnormal findings: Secondary | ICD-10-CM | POA: Diagnosis not present

## 2019-10-24 DIAGNOSIS — Z9079 Acquired absence of other genital organ(s): Secondary | ICD-10-CM | POA: Diagnosis not present

## 2019-10-24 DIAGNOSIS — N401 Enlarged prostate with lower urinary tract symptoms: Secondary | ICD-10-CM | POA: Diagnosis not present

## 2019-11-13 DIAGNOSIS — Z23 Encounter for immunization: Secondary | ICD-10-CM | POA: Diagnosis not present

## 2019-12-18 DIAGNOSIS — E7849 Other hyperlipidemia: Secondary | ICD-10-CM | POA: Diagnosis not present

## 2019-12-18 DIAGNOSIS — I10 Essential (primary) hypertension: Secondary | ICD-10-CM | POA: Diagnosis not present

## 2020-01-02 DIAGNOSIS — Z0001 Encounter for general adult medical examination with abnormal findings: Secondary | ICD-10-CM | POA: Diagnosis not present

## 2020-01-02 DIAGNOSIS — E782 Mixed hyperlipidemia: Secondary | ICD-10-CM | POA: Diagnosis not present

## 2020-01-02 DIAGNOSIS — Z6828 Body mass index (BMI) 28.0-28.9, adult: Secondary | ICD-10-CM | POA: Diagnosis not present

## 2020-01-02 DIAGNOSIS — E039 Hypothyroidism, unspecified: Secondary | ICD-10-CM | POA: Diagnosis not present

## 2020-01-06 DIAGNOSIS — M170 Bilateral primary osteoarthritis of knee: Secondary | ICD-10-CM | POA: Diagnosis not present

## 2020-01-17 DIAGNOSIS — I10 Essential (primary) hypertension: Secondary | ICD-10-CM | POA: Diagnosis not present

## 2020-01-17 DIAGNOSIS — E7849 Other hyperlipidemia: Secondary | ICD-10-CM | POA: Diagnosis not present

## 2020-03-18 DIAGNOSIS — M5416 Radiculopathy, lumbar region: Secondary | ICD-10-CM | POA: Diagnosis not present

## 2020-03-18 DIAGNOSIS — N401 Enlarged prostate with lower urinary tract symptoms: Secondary | ICD-10-CM | POA: Diagnosis not present

## 2020-03-18 DIAGNOSIS — E7849 Other hyperlipidemia: Secondary | ICD-10-CM | POA: Diagnosis not present

## 2020-03-18 DIAGNOSIS — E039 Hypothyroidism, unspecified: Secondary | ICD-10-CM | POA: Diagnosis not present

## 2020-04-06 DIAGNOSIS — H5203 Hypermetropia, bilateral: Secondary | ICD-10-CM | POA: Diagnosis not present

## 2020-04-06 DIAGNOSIS — H5213 Myopia, bilateral: Secondary | ICD-10-CM | POA: Diagnosis not present

## 2020-04-06 DIAGNOSIS — H524 Presbyopia: Secondary | ICD-10-CM | POA: Diagnosis not present

## 2020-04-06 DIAGNOSIS — H2510 Age-related nuclear cataract, unspecified eye: Secondary | ICD-10-CM | POA: Diagnosis not present

## 2020-04-06 DIAGNOSIS — H52229 Regular astigmatism, unspecified eye: Secondary | ICD-10-CM | POA: Diagnosis not present

## 2020-04-17 DIAGNOSIS — E039 Hypothyroidism, unspecified: Secondary | ICD-10-CM | POA: Diagnosis not present

## 2020-04-17 DIAGNOSIS — M5416 Radiculopathy, lumbar region: Secondary | ICD-10-CM | POA: Diagnosis not present

## 2020-04-17 DIAGNOSIS — E7849 Other hyperlipidemia: Secondary | ICD-10-CM | POA: Diagnosis not present

## 2020-04-17 DIAGNOSIS — N401 Enlarged prostate with lower urinary tract symptoms: Secondary | ICD-10-CM | POA: Diagnosis not present

## 2020-04-22 DIAGNOSIS — E782 Mixed hyperlipidemia: Secondary | ICD-10-CM | POA: Diagnosis not present

## 2020-04-22 DIAGNOSIS — E039 Hypothyroidism, unspecified: Secondary | ICD-10-CM | POA: Diagnosis not present

## 2020-05-19 DIAGNOSIS — E7849 Other hyperlipidemia: Secondary | ICD-10-CM | POA: Diagnosis not present

## 2020-05-19 DIAGNOSIS — N401 Enlarged prostate with lower urinary tract symptoms: Secondary | ICD-10-CM | POA: Diagnosis not present

## 2020-05-19 DIAGNOSIS — E039 Hypothyroidism, unspecified: Secondary | ICD-10-CM | POA: Diagnosis not present

## 2020-05-19 DIAGNOSIS — M5416 Radiculopathy, lumbar region: Secondary | ICD-10-CM | POA: Diagnosis not present

## 2020-07-14 DIAGNOSIS — Z23 Encounter for immunization: Secondary | ICD-10-CM | POA: Diagnosis not present

## 2020-09-28 DIAGNOSIS — L853 Xerosis cutis: Secondary | ICD-10-CM | POA: Diagnosis not present

## 2020-09-28 DIAGNOSIS — L570 Actinic keratosis: Secondary | ICD-10-CM | POA: Diagnosis not present

## 2020-09-28 DIAGNOSIS — L280 Lichen simplex chronicus: Secondary | ICD-10-CM | POA: Diagnosis not present

## 2020-09-28 DIAGNOSIS — L814 Other melanin hyperpigmentation: Secondary | ICD-10-CM | POA: Diagnosis not present

## 2020-10-02 ENCOUNTER — Other Ambulatory Visit: Payer: Self-pay

## 2020-10-02 ENCOUNTER — Encounter: Payer: Self-pay | Admitting: Urology

## 2020-10-02 ENCOUNTER — Ambulatory Visit (INDEPENDENT_AMBULATORY_CARE_PROVIDER_SITE_OTHER): Payer: Medicare HMO | Admitting: Urology

## 2020-10-02 VITALS — BP 142/78 | HR 61 | Temp 98.5°F | Ht 72.0 in | Wt 205.0 lb

## 2020-10-02 DIAGNOSIS — N401 Enlarged prostate with lower urinary tract symptoms: Secondary | ICD-10-CM

## 2020-10-02 DIAGNOSIS — R3912 Poor urinary stream: Secondary | ICD-10-CM | POA: Insufficient documentation

## 2020-10-02 DIAGNOSIS — R972 Elevated prostate specific antigen [PSA]: Secondary | ICD-10-CM | POA: Diagnosis not present

## 2020-10-02 DIAGNOSIS — N138 Other obstructive and reflux uropathy: Secondary | ICD-10-CM

## 2020-10-02 LAB — URINALYSIS, ROUTINE W REFLEX MICROSCOPIC
Bilirubin, UA: NEGATIVE
Glucose, UA: NEGATIVE
Ketones, UA: NEGATIVE
Nitrite, UA: NEGATIVE
Protein,UA: NEGATIVE
Specific Gravity, UA: 1.025 (ref 1.005–1.030)
Urobilinogen, Ur: 0.2 mg/dL (ref 0.2–1.0)
pH, UA: 5.5 (ref 5.0–7.5)

## 2020-10-02 LAB — MICROSCOPIC EXAMINATION
Bacteria, UA: NONE SEEN
Renal Epithel, UA: NONE SEEN /hpf

## 2020-10-02 LAB — BLADDER SCAN AMB NON-IMAGING: Scan Result: 56

## 2020-10-02 NOTE — Progress Notes (Signed)
Urological Symptom Review  Patient is experiencing the following symptoms: None   Review of Systems  Gastrointestinal (upper)  : Negative for upper GI symptoms  Gastrointestinal (lower) : Negative for lower GI symptoms  Constitutional : Negative for symptoms  Skin: Skin rash/lesion  Eyes: Negative for eye symptoms  Ear/Nose/Throat : Negative for Ear/Nose/Throat symptoms  Hematologic/Lymphatic: Negative for Hematologic/Lymphatic symptoms  Cardiovascular : Negative for cardiovascular symptoms  Respiratory : Negative for respiratory symptoms  Endocrine: Negative for endocrine symptoms  Musculoskeletal: Joint pain  Neurological: Negative for neurological symptoms  Psychologic: Depression

## 2020-10-02 NOTE — Progress Notes (Signed)
10/02/2020 11:54 AM   Hector Kelley 1946/07/28 [id number]  Referring provider: Curlene Labrum, MD [place],  [place] [address]  Followup BPH and elevated PSA  HPI: Hector Kelley is a 75yo here for followup for elevated PSA and BPH. PSA drawn las week in [place].  He denies any significant LUTS. He has a hx of simple prostate in 01/2019. NO new complaints   His records from [facility] are as follows: I have an enlarged prostate (follow-up).  HPI: Hector Kelley is a 75 year-old male established patient who is here for an enlarged prostate follow-up evaluation.  He is currently taking none. He is not on new medications for symptoms of prostate enlargement.   He does have an abnormal sensation when needing to urinate. He is not having problems getting his urine stream started. He does not have a good size and strength to his urinary stream. He is having problems with emptying his bladder well. He does not dribble at the end of urination.   10/19/2018: The patient has been followed by Dr. Jeffie Pollock for BPH and elevated PSA. He has undergone prostate biopsy which was negative. He is currently flomax BID and finasteride. He is unhappy with his current regiment. He has a hx of cholecystectomy laparoscopic.   01/17/2019: He was scheduled to undergo robotic simple prostatectomy which was postponed due to COVID-19.   02/21/19: Patient with history of BPH with significant LUTs and urinary retention managed with [facility]. His TRUS volume is 150cc. He underwent robotic simple prostatectomy on 5/28. He is scheduled for voiding trial and cystogram on 6/11. Pathology was noted to be benign. Today he presents with decreased drainage from his catheter. He states that the last time he noted any signficant output into his drainage bag was around midnight. He has been having episodes of painful urgency and leakage from around his catheter. He had noted some intermittent hematuria, but states that for the past several days  his urine has been relatively clear. He denies clots. He denies significant abdominal pain or suprapubic discomfort. He denies fevers, chills, nausea, or vomiting. He states that his appetite is getting back to normal and he is tolerating PO intake well. He did have bowel movement yesterday.   02/28/2019: voiding trial passed today. s/p robotic simple prostatectomy. pathology benign   04/04/2019: s/p robotic simple prostatectomy. No hematuria. stream strong. occasional urgency which is improving.   10/04/2019: PSA decreased to 2.87. Nocturia 1x. he has mild LUTS     CC: I have urinary urgency.  HPI: He does have urgency. He does not have problems getting to the bathroom in time after he has the urge to urinate. The condition started approximately 02/21/2019. His symptoms have not gotten worse over the last year.   He does not wear protective pads. He generally urinates every 3 hours in the daytime. He gets up at night to urinate 2 times. He is not having problems with emptying his bladder well.   04/04/2019: After simple prostatectomy he noted worsening urgency and rare urge incontinence. The urgency is improving   10/04/2019: Urgency resolved since last visit. mild LUTS.     CC: I am having trouble with my erections.  HPI: He first stated noticing pain on approximately 03/20/2015. His symptoms did begin gradually. His symptoms have been worse over the last year.   He does have difficulties achieving an erection. He does have problems maintaining his erections. His erections are straight.   He does not  have premature ejaculation. He does not have trouble reaching climax. He does not have anxiety because of the symptoms.   04/04/2019: FOr the past 4 years he notes a semifirm erection. no prior ED therapy. good exercise tolerance.   10/04/2019: He used cialis since last visit with good results.     AUA Symptom Score: Less than 50% of the time he has the sensation of not emptying his bladder  completely when finished urinating. Less than 20% of the time he has to urinate again fewer than two hours after he has finished urinating. 50% of the time he has to start and stop again several times when he urinates. Less than 20% of the time he finds it difficult to postpone urination. More than 50% of the time he has a weak urinary stream. 50% of the time he has to push or strain to begin urination. He has to get up to urinate 3 times from the time he goes to bed until the time he gets up in the morning.   Calculated AUA Symptom Score: 17    IIEF-5 Score: The patient's confidence that he can get an erection is low. The patient's erections were hard enough for penetration sometimes. The patient was able to maintain his erection after he had penetrated his partner sometimes. During sexual intercouse, it was very difficult to maintain his erection to the completion of intercourse. The patient found sexual intercourse satisfactory sometimes.   Calculated IIEF-5 Symptom Score: 13    QOL Score: He would feel mixed if he had to live with his urinary condition the way it is now for the rest of his life.   Calculated QOL Symptom Score: 3     PMH: Past Medical History:  Diagnosis Date  . Arthritis   . BPH with obstruction/lower urinary tract symptoms   . Complication of anesthesia    urine retention, requiring catheter after anesthesia  . Depression   . Diverticulosis   . H/O clavicle fracture    Right  . Hypothyroidism   . Wears contact lenses     Surgical History: Past Surgical History:  Procedure Laterality Date  . BACK SURGERY  1980   Lumbar Laminectomy   . CHOLECYSTECTOMY    . CIRCUMCISION    . COLONOSCOPY    . KNEE ARTHROSCOPY Left    2016  . KNEE ARTHROSCOPY AND ARTHROTOMY Right 1980  . XI ROBOTIC ASSISTED SIMPLE PROSTATECTOMY N/A 02/14/2019   Procedure: XI ROBOTIC ASSISTED SIMPLE PROSTATECTOMY;  Surgeon: Cleon Gustin, MD;  Location: [facility];  Service: Urology;   Laterality: N/A;  3 HRS    Home Medications:  Allergies as of 10/02/2020   No Known Allergies     Medication List       Accurate as of October 02, 2020 11:54 AM. If you have any questions, ask your nurse or doctor.        STOP taking these medications   sulfamethoxazole-trimethoprim 800-160 MG tablet Commonly known as: BACTRIM DS Stopped by: Nicolette Bang, MD     TAKE these medications   ARTIFICIAL TEARS OP Place 1 drop into both eyes daily as needed (dry eyes).   cetirizine 10 MG tablet Commonly known as: ZYRTEC Take 10 mg by mouth daily as needed for allergies.   finasteride 5 MG tablet Commonly known as: PROSCAR Take 5 mg by mouth daily after supper.   HYDROcodone-acetaminophen 5-325 MG tablet Commonly known as: Norco Take 1-2 tablets by mouth every 6 (six) hours as  needed for moderate pain or severe pain.   levothyroxine 125 MCG tablet Commonly known as: SYNTHROID Take 125 mcg by mouth daily before breakfast.       Allergies: No Known Allergies  Family History: No family history on file.  Social History:  reports that he has never smoked. He has never used smokeless tobacco. He reports that he does not drink alcohol and does not use drugs.  ROS: All other review of systems were reviewed and are negative except what is noted above in HPI  Physical Exam: BP (!) 142/78   Pulse 61   Temp 98.5 F (36.9 C)   Ht 6' (1.829 m)   Wt 205 lb (93 kg)   BMI 27.80 kg/m   Constitutional:  Alert and oriented, No acute distress. HEENT: [place] AT, moist mucus membranes.  Trachea midline, no masses. Cardiovascular: No clubbing, cyanosis, or edema. Respiratory: Normal respiratory effort, no increased work of breathing. GI: Abdomen is soft, nontender, nondistended, no abdominal masses GU: No CVA tenderness.  Lymph: No cervical or inguinal lymphadenopathy. Skin: No rashes, bruises or suspicious lesions. Neurologic: Grossly intact, no focal deficits, moving all 4  extremities. Psychiatric: Normal mood and affect.  Laboratory Data: Lab Results  Component Value Date   WBC 6.3 02/07/2019   HGB 11.8 (L) 02/15/2019   HCT 37.4 (L) 02/15/2019   MCV 95.6 02/07/2019   PLT 311 02/07/2019    Lab Results  Component Value Date   CREATININE 0.91 02/15/2019    No results found for: PSA  No results found for: TESTOSTERONE  No results found for: HGBA1C  Urinalysis    Component Value Date/Time   APPEARANCEUR Clear 10/02/2020 1111   GLUCOSEU Negative 10/02/2020 1111   BILIRUBINUR Negative 10/02/2020 1111   PROTEINUR Negative 10/02/2020 1111   NITRITE Negative 10/02/2020 1111   LEUKOCYTESUR Trace (A) 10/02/2020 1111    Lab Results  Component Value Date   LABMICR See below: 10/02/2020   WBCUA 6-10 (A) 10/02/2020   LABEPIT 0-10 10/02/2020   BACTERIA None seen 10/02/2020    Pertinent Imaging:  No results found for this or any previous visit.  No results found for this or any previous visit.  No results found for this or any previous visit.  No results found for this or any previous visit.  No results found for this or any previous visit.  No results found for this or any previous visit.  No results found for this or any previous visit.  No results found for this or any previous visit.   Assessment & Plan:    1. BPH with obstruction/lower urinary tract symptoms -mild LUTS after simple prostectomy - Urinalysis, Routine w reflex microscopic - Bladder Scan (Post Void Residual) in office - Urinalysis, Routine w reflex microscopic  2. Elevated PSA Patient to call with PSA results, if less than 4 then he can followup PRN  3. Weak urinary stream -resolved.   No follow-ups on file.  Nicolette Bang, MD  [facility] [place]

## 2020-10-02 NOTE — Patient Instructions (Signed)

## 2020-10-19 DIAGNOSIS — L2082 Flexural eczema: Secondary | ICD-10-CM | POA: Diagnosis not present

## 2020-10-19 DIAGNOSIS — L814 Other melanin hyperpigmentation: Secondary | ICD-10-CM | POA: Diagnosis not present

## 2020-10-19 DIAGNOSIS — L280 Lichen simplex chronicus: Secondary | ICD-10-CM | POA: Diagnosis not present

## 2020-10-22 DIAGNOSIS — E782 Mixed hyperlipidemia: Secondary | ICD-10-CM | POA: Diagnosis not present

## 2020-10-22 DIAGNOSIS — Z0001 Encounter for general adult medical examination with abnormal findings: Secondary | ICD-10-CM | POA: Diagnosis not present

## 2020-10-22 DIAGNOSIS — E039 Hypothyroidism, unspecified: Secondary | ICD-10-CM | POA: Diagnosis not present

## 2020-10-22 DIAGNOSIS — E7849 Other hyperlipidemia: Secondary | ICD-10-CM | POA: Diagnosis not present

## 2020-10-22 DIAGNOSIS — Z1321 Encounter for screening for nutritional disorder: Secondary | ICD-10-CM | POA: Diagnosis not present

## 2020-10-28 DIAGNOSIS — N401 Enlarged prostate with lower urinary tract symptoms: Secondary | ICD-10-CM | POA: Diagnosis not present

## 2020-10-28 DIAGNOSIS — E039 Hypothyroidism, unspecified: Secondary | ICD-10-CM | POA: Diagnosis not present

## 2020-10-28 DIAGNOSIS — E7849 Other hyperlipidemia: Secondary | ICD-10-CM | POA: Diagnosis not present

## 2020-10-28 DIAGNOSIS — Z6828 Body mass index (BMI) 28.0-28.9, adult: Secondary | ICD-10-CM | POA: Diagnosis not present

## 2020-10-28 DIAGNOSIS — Z23 Encounter for immunization: Secondary | ICD-10-CM | POA: Diagnosis not present

## 2020-10-28 DIAGNOSIS — R972 Elevated prostate specific antigen [PSA]: Secondary | ICD-10-CM | POA: Diagnosis not present

## 2020-10-28 DIAGNOSIS — Z0001 Encounter for general adult medical examination with abnormal findings: Secondary | ICD-10-CM | POA: Diagnosis not present

## 2021-01-07 DIAGNOSIS — H43812 Vitreous degeneration, left eye: Secondary | ICD-10-CM | POA: Diagnosis not present

## 2021-02-05 DIAGNOSIS — H2513 Age-related nuclear cataract, bilateral: Secondary | ICD-10-CM | POA: Diagnosis not present

## 2021-02-05 DIAGNOSIS — H43812 Vitreous degeneration, left eye: Secondary | ICD-10-CM | POA: Diagnosis not present

## 2021-02-18 DIAGNOSIS — Z23 Encounter for immunization: Secondary | ICD-10-CM | POA: Diagnosis not present

## 2021-03-15 DIAGNOSIS — L308 Other specified dermatitis: Secondary | ICD-10-CM | POA: Diagnosis not present

## 2021-03-15 DIAGNOSIS — L570 Actinic keratosis: Secondary | ICD-10-CM | POA: Diagnosis not present

## 2021-03-15 DIAGNOSIS — D485 Neoplasm of uncertain behavior of skin: Secondary | ICD-10-CM | POA: Diagnosis not present

## 2021-03-15 DIAGNOSIS — Z1283 Encounter for screening for malignant neoplasm of skin: Secondary | ICD-10-CM | POA: Diagnosis not present

## 2021-03-15 DIAGNOSIS — L280 Lichen simplex chronicus: Secondary | ICD-10-CM | POA: Diagnosis not present

## 2021-03-15 DIAGNOSIS — L309 Dermatitis, unspecified: Secondary | ICD-10-CM | POA: Diagnosis not present

## 2021-03-23 DIAGNOSIS — Z125 Encounter for screening for malignant neoplasm of prostate: Secondary | ICD-10-CM | POA: Diagnosis not present

## 2021-07-26 DIAGNOSIS — Z23 Encounter for immunization: Secondary | ICD-10-CM | POA: Diagnosis not present

## 2021-08-26 DIAGNOSIS — E559 Vitamin D deficiency, unspecified: Secondary | ICD-10-CM | POA: Diagnosis not present

## 2021-08-26 DIAGNOSIS — N400 Enlarged prostate without lower urinary tract symptoms: Secondary | ICD-10-CM | POA: Diagnosis not present

## 2021-08-26 DIAGNOSIS — E7849 Other hyperlipidemia: Secondary | ICD-10-CM | POA: Diagnosis not present

## 2021-08-26 DIAGNOSIS — E782 Mixed hyperlipidemia: Secondary | ICD-10-CM | POA: Diagnosis not present

## 2021-08-26 DIAGNOSIS — R972 Elevated prostate specific antigen [PSA]: Secondary | ICD-10-CM | POA: Diagnosis not present

## 2021-08-26 DIAGNOSIS — E039 Hypothyroidism, unspecified: Secondary | ICD-10-CM | POA: Diagnosis not present

## 2021-09-02 DIAGNOSIS — M65311 Trigger thumb, right thumb: Secondary | ICD-10-CM | POA: Diagnosis not present

## 2021-09-02 DIAGNOSIS — E7849 Other hyperlipidemia: Secondary | ICD-10-CM | POA: Diagnosis not present

## 2021-09-02 DIAGNOSIS — R972 Elevated prostate specific antigen [PSA]: Secondary | ICD-10-CM | POA: Diagnosis not present

## 2021-09-02 DIAGNOSIS — E039 Hypothyroidism, unspecified: Secondary | ICD-10-CM | POA: Diagnosis not present

## 2021-09-02 DIAGNOSIS — N401 Enlarged prostate with lower urinary tract symptoms: Secondary | ICD-10-CM | POA: Diagnosis not present

## 2021-09-27 ENCOUNTER — Other Ambulatory Visit: Payer: Self-pay

## 2021-09-27 ENCOUNTER — Ambulatory Visit: Payer: Medicare HMO | Admitting: Urology

## 2021-09-27 ENCOUNTER — Encounter: Payer: Self-pay | Admitting: Urology

## 2021-09-27 VITALS — BP 152/90 | HR 60 | Ht 72.0 in | Wt 205.0 lb

## 2021-09-27 DIAGNOSIS — N401 Enlarged prostate with lower urinary tract symptoms: Secondary | ICD-10-CM | POA: Diagnosis not present

## 2021-09-27 DIAGNOSIS — N138 Other obstructive and reflux uropathy: Secondary | ICD-10-CM | POA: Diagnosis not present

## 2021-09-27 DIAGNOSIS — R972 Elevated prostate specific antigen [PSA]: Secondary | ICD-10-CM

## 2021-09-27 DIAGNOSIS — R3912 Poor urinary stream: Secondary | ICD-10-CM | POA: Diagnosis not present

## 2021-09-27 LAB — MICROSCOPIC EXAMINATION: Renal Epithel, UA: NONE SEEN /hpf

## 2021-09-27 LAB — URINALYSIS, ROUTINE W REFLEX MICROSCOPIC
Bilirubin, UA: NEGATIVE
Glucose, UA: NEGATIVE
Ketones, UA: NEGATIVE
Nitrite, UA: NEGATIVE
Protein,UA: NEGATIVE
Specific Gravity, UA: 1.025 (ref 1.005–1.030)
Urobilinogen, Ur: 0.2 mg/dL (ref 0.2–1.0)
pH, UA: 5 (ref 5.0–7.5)

## 2021-09-27 NOTE — Patient Instructions (Signed)

## 2021-09-27 NOTE — Progress Notes (Signed)
09/27/2021 10:15 AM   Traci Sermon December 05, 1945 [id number]  Referring provider: Curlene Labrum, MD [place],  [place] [address]  Followup BPH   HPI: Mr Hector Kelley is a 76yo here for followup for BPH. He underwent simple prostatectomy 2 years ago. PSA 3.4. IPSS 3 QOL 1. Nocturia 1x. Uirne stream strong. No other complaints.    PMH: Past Medical History:  Diagnosis Date   Arthritis    BPH with obstruction/lower urinary tract symptoms    Complication of anesthesia    urine retention, requiring catheter after anesthesia   Depression    Diverticulosis    H/O clavicle fracture    Right   Hypothyroidism    Wears contact lenses     Surgical History: Past Surgical History:  Procedure Laterality Date   BACK SURGERY  1980   Lumbar Laminectomy    CHOLECYSTECTOMY     CIRCUMCISION     COLONOSCOPY     KNEE ARTHROSCOPY Left    2016   KNEE ARTHROSCOPY AND ARTHROTOMY Right 1980   XI ROBOTIC ASSISTED SIMPLE PROSTATECTOMY N/A 02/14/2019   Procedure: XI ROBOTIC ASSISTED SIMPLE PROSTATECTOMY;  Surgeon: Cleon Gustin, MD;  Location: [facility];  Service: Urology;  Laterality: N/A;  3 HRS    Home Medications:  Allergies as of 09/27/2021   No Known Allergies      Medication List        Accurate as of September 27, 2021 10:15 AM. If you have any questions, ask your nurse or doctor.          ARTIFICIAL TEARS OP Place 1 drop into both eyes daily as needed (dry eyes).   cetirizine 10 MG tablet Commonly known as: ZYRTEC Take 10 mg by mouth daily as needed for allergies.   finasteride 5 MG tablet Commonly known as: PROSCAR Take 5 mg by mouth daily after supper.   HYDROcodone-acetaminophen 5-325 MG tablet Commonly known as: Norco Take 1-2 tablets by mouth every 6 (six) hours as needed for moderate pain or severe pain.   levothyroxine 125 MCG tablet Commonly known as: SYNTHROID Take 125 mcg by mouth daily before breakfast.        Allergies: No Known  Allergies  Family History: No family history on file.  Social History:  reports that he has never smoked. He has never used smokeless tobacco. He reports that he does not drink alcohol and does not use drugs.  ROS: All other review of systems were reviewed and are negative except what is noted above in HPI  Physical Exam: BP (!) 152/90    Pulse 60    Ht 6' (1.829 m)    Wt 205 lb (93 kg)    BMI 27.80 kg/m   Constitutional:  Alert and oriented, No acute distress. HEENT: [place] AT, moist mucus membranes.  Trachea midline, no masses. Cardiovascular: No clubbing, cyanosis, or edema. Respiratory: Normal respiratory effort, no increased work of breathing. GI: Abdomen is soft, nontender, nondistended, no abdominal masses GU: No CVA tenderness.  Lymph: No cervical or inguinal lymphadenopathy. Skin: No rashes, bruises or suspicious lesions. Neurologic: Grossly intact, no focal deficits, moving all 4 extremities. Psychiatric: Normal mood and affect.  Laboratory Data: Lab Results  Component Value Date   WBC 6.3 02/07/2019   HGB 11.8 (L) 02/15/2019   HCT 37.4 (L) 02/15/2019   MCV 95.6 02/07/2019   PLT 311 02/07/2019    Lab Results  Component Value Date   CREATININE 0.91 02/15/2019  No results found for: PSA  No results found for: TESTOSTERONE  No results found for: HGBA1C  Urinalysis    Component Value Date/Time   APPEARANCEUR Clear 10/02/2020 1111   GLUCOSEU Negative 10/02/2020 1111   BILIRUBINUR Negative 10/02/2020 1111   PROTEINUR Negative 10/02/2020 1111   NITRITE Negative 10/02/2020 1111   LEUKOCYTESUR Trace (A) 10/02/2020 1111    Lab Results  Component Value Date   LABMICR See below: 10/02/2020   WBCUA 6-10 (A) 10/02/2020   LABEPIT 0-10 10/02/2020   BACTERIA None seen 10/02/2020    Pertinent Imaging:  No results found for this or any previous visit.  No results found for this or any previous visit.  No results found for this or any previous visit.  No  results found for this or any previous visit.  No results found for this or any previous visit.  No results found for this or any previous visit.  No results found for this or any previous visit.  No results found for this or any previous visit.   Assessment & Plan:    1. Elevated PSA -RCT 1 year with PSA  2. Weak urinary stream -Resolved  3. BPH with obstruction/lower urinary tract symptoms -Patient doing well after simple prostatectomy - Urinalysis, Routine w reflex microscopic   No follow-ups on file.  Nicolette Bang, MD  [facility] [place]

## 2021-09-27 NOTE — Progress Notes (Signed)
Urological Symptom Review  Patient is experiencing the following symptoms: Get up at night to urinate   Review of Systems  Gastrointestinal (upper)  : Negative for upper GI symptoms  Gastrointestinal (lower) : Negative for lower GI symptoms  Constitutional : Negative for symptoms  Skin: Negative for skin symptoms  Eyes: Negative for eye symptoms  Ear/Nose/Throat : Negative for Ear/Nose/Throat symptoms  Hematologic/Lymphatic: Negative for Hematologic/Lymphatic symptoms  Cardiovascular : Negative for cardiovascular symptoms  Respiratory : Negative for respiratory symptoms  Endocrine: Negative for endocrine symptoms  Musculoskeletal: Negative for musculoskeletal symptoms  Neurological: Negative for neurological symptoms  Psychologic: Depression

## 2021-09-28 DIAGNOSIS — Z23 Encounter for immunization: Secondary | ICD-10-CM | POA: Diagnosis not present

## 2021-10-04 ENCOUNTER — Ambulatory Visit: Payer: Medicare HMO | Admitting: Urology

## 2021-11-24 DIAGNOSIS — B001 Herpesviral vesicular dermatitis: Secondary | ICD-10-CM | POA: Diagnosis not present

## 2021-11-24 DIAGNOSIS — L0100 Impetigo, unspecified: Secondary | ICD-10-CM | POA: Diagnosis not present

## 2021-11-24 DIAGNOSIS — Z6828 Body mass index (BMI) 28.0-28.9, adult: Secondary | ICD-10-CM | POA: Diagnosis not present

## 2022-03-02 DIAGNOSIS — Z0001 Encounter for general adult medical examination with abnormal findings: Secondary | ICD-10-CM | POA: Diagnosis not present

## 2022-03-02 DIAGNOSIS — E039 Hypothyroidism, unspecified: Secondary | ICD-10-CM | POA: Diagnosis not present

## 2022-03-02 DIAGNOSIS — E7849 Other hyperlipidemia: Secondary | ICD-10-CM | POA: Diagnosis not present

## 2022-03-02 DIAGNOSIS — E782 Mixed hyperlipidemia: Secondary | ICD-10-CM | POA: Diagnosis not present

## 2022-03-02 DIAGNOSIS — R972 Elevated prostate specific antigen [PSA]: Secondary | ICD-10-CM | POA: Diagnosis not present

## 2022-03-07 DIAGNOSIS — Z6828 Body mass index (BMI) 28.0-28.9, adult: Secondary | ICD-10-CM | POA: Diagnosis not present

## 2022-03-07 DIAGNOSIS — Z0100 Encounter for examination of eyes and vision without abnormal findings: Secondary | ICD-10-CM | POA: Diagnosis not present

## 2022-03-07 DIAGNOSIS — N401 Enlarged prostate with lower urinary tract symptoms: Secondary | ICD-10-CM | POA: Diagnosis not present

## 2022-03-07 DIAGNOSIS — Z0001 Encounter for general adult medical examination with abnormal findings: Secondary | ICD-10-CM | POA: Diagnosis not present

## 2022-03-07 DIAGNOSIS — R972 Elevated prostate specific antigen [PSA]: Secondary | ICD-10-CM | POA: Diagnosis not present

## 2022-03-07 DIAGNOSIS — E039 Hypothyroidism, unspecified: Secondary | ICD-10-CM | POA: Diagnosis not present

## 2022-03-07 DIAGNOSIS — E7849 Other hyperlipidemia: Secondary | ICD-10-CM | POA: Diagnosis not present

## 2022-07-14 ENCOUNTER — Emergency Department (HOSPITAL_COMMUNITY)
Admission: EM | Admit: 2022-07-14 | Discharge: 2022-07-14 | Disposition: A | Discharge status: Home / Self Care | Payer: Medicare HMO | Attending: Student | Admitting: Student

## 2022-07-14 ENCOUNTER — Emergency Department (HOSPITAL_COMMUNITY): Payer: Medicare HMO

## 2022-07-14 ENCOUNTER — Encounter (HOSPITAL_COMMUNITY): Payer: Self-pay

## 2022-07-14 DIAGNOSIS — R030 Elevated blood-pressure reading, without diagnosis of hypertension: Secondary | ICD-10-CM | POA: Diagnosis not present

## 2022-07-14 DIAGNOSIS — N50819 Testicular pain, unspecified: Secondary | ICD-10-CM | POA: Diagnosis not present

## 2022-07-14 DIAGNOSIS — N50811 Right testicular pain: Secondary | ICD-10-CM | POA: Insufficient documentation

## 2022-07-14 DIAGNOSIS — N433 Hydrocele, unspecified: Secondary | ICD-10-CM | POA: Diagnosis not present

## 2022-07-14 DIAGNOSIS — Z6828 Body mass index (BMI) 28.0-28.9, adult: Secondary | ICD-10-CM | POA: Diagnosis not present

## 2022-07-14 DIAGNOSIS — N5089 Other specified disorders of the male genital organs: Secondary | ICD-10-CM | POA: Diagnosis not present

## 2022-07-14 LAB — URINALYSIS, ROUTINE W REFLEX MICROSCOPIC
Bilirubin Urine: NEGATIVE
Glucose, UA: NEGATIVE mg/dL
Hgb urine dipstick: NEGATIVE
Ketones, ur: NEGATIVE mg/dL
Leukocytes,Ua: NEGATIVE
Nitrite: NEGATIVE
Protein, ur: NEGATIVE mg/dL
Specific Gravity, Urine: 1.019 (ref 1.005–1.030)
pH: 6 (ref 5.0–8.0)

## 2022-07-14 MED ORDER — KETOROLAC TROMETHAMINE 15 MG/ML IJ SOLN: 15.0000 mg | Freq: Once | INTRAMUSCULAR | Status: DC

## 2022-07-14 MED ORDER — NAPROXEN 375 MG PO TABS: 375.0000 mg | ORAL_TABLET | Freq: Two times a day (BID) | ORAL | 0 refills | Status: DC

## 2022-07-14 NOTE — ED Provider Notes (Signed)
[facility] EMERGENCY DEPARTMENT Provider Note   CSN: [id number] Arrival date & time: 07/14/22  1647     History Chief Complaint  Patient presents with   Testicle Pain    Hector Kelley is a 76 y.o. male patient who presents to the emergency department today for further evaluation of right testicular pain that started 2 days ago after a bike ride.  He has been having constant testicular pain since.  He reports associated testicular swelling and scrotal swelling.  Denies any fever, chills, dysuria, urinary frequency, or urinary urgency.    Testicle Pain       Home Medications Prior to Admission medications   Medication Sig Start Date End Date Taking? Authorizing Provider  naproxen (NAPROSYN) 375 MG tablet Take 1 tablet (375 mg total) by mouth 2 (two) times daily. 07/14/22  Yes Raul Del, Mellissa Conley M, PA-C  Carboxymethylcellulose Sodium (ARTIFICIAL TEARS OP) Place 1 drop into both eyes daily as needed (dry eyes).    [provider]  cetirizine (ZYRTEC) 10 MG tablet Take 10 mg by mouth daily as needed for allergies. Patient not taking: Reported on 10/02/2020    [provider]  finasteride (PROSCAR) 5 MG tablet Take 5 mg by mouth daily after supper.     [provider]  HYDROcodone-acetaminophen (NORCO) 5-325 MG tablet Take 1-2 tablets by mouth every 6 (six) hours as needed for moderate pain or severe pain. Patient not taking: Reported on 10/02/2020 02/14/19   Debbrah Alar, PA-C  levothyroxine (SYNTHROID, LEVOTHROID) 125 MCG tablet Take 125 mcg by mouth daily before breakfast.    [provider]      Allergies    Patient has no known allergies.    Review of Systems   Review of Systems  Genitourinary:  Positive for testicular pain.  All other systems reviewed and are negative.   Physical Exam Updated Vital Signs BP 130/79   Pulse 64   Temp 98.1 F (36.7 C) (Oral)   Resp 18   Ht 6' (1.829 m)   Wt 93 kg   SpO2 97%   BMI 27.80 kg/m   Physical Exam Vitals and nursing note reviewed.  Constitutional:      Appearance: Normal appearance.  HENT:     Head: Normocephalic and atraumatic.  Eyes:     General:        Right eye: No discharge.        Left eye: No discharge.     Conjunctiva/sclera: Conjunctivae normal.  Pulmonary:     Effort: Pulmonary effort is normal.  Genitourinary:    Comments: Penis is normal and circumcised.  There is right testicular swelling and tenderness over the testicle itself and epididymis.  Right testicle slightly lifted in comparison to the left. Skin:    General: Skin is warm and dry.     Findings: No rash.  Neurological:     General: No focal deficit present.     Mental Status: He is alert.  Psychiatric:        Mood and Affect: Mood normal.        Behavior: Behavior normal.     ED Results / Procedures / Treatments   Labs (all labs ordered are listed, but only abnormal results are displayed) Labs Reviewed  URINALYSIS, ROUTINE W REFLEX MICROSCOPIC    EKG None  Radiology US SCROTUM W/DOPPLER  Result Date: 07/14/2022 CLINICAL DATA:  Testicle pain EXAM: SCROTAL ULTRASOUND DOPPLER ULTRASOUND OF THE TESTICLES TECHNIQUE: Complete ultrasound examination of the testicles, epididymis,  and other scrotal structures was performed. Color and spectral Doppler ultrasound were also utilized to evaluate blood flow to the testicles. COMPARISON:  None Available. FINDINGS: Right testicle Measurements: 4.7 x 3 x 3.4 cm. No mass or microlithiasis visualized. Left testicle Measurements: 4.8 x 2.6 x 3 cm. No mass or microlithiasis visualized. Right epididymis: Slightly heterogeneous and enlarged compared to the left. Left epididymis:  Normal in size and appearance. Hydrocele:  Small bilateral hydroceles. Varicocele:  None visualized. Pulsed Doppler interrogation of both testes demonstrates normal low resistance arterial and venous waveforms bilaterally. IMPRESSION: 1. Negative for acute testicular torsion 2.  Small bilateral hydroceles 3. Slight asymmetric heterogeneity and enlargement of right epididymis compared to left which could be due to mild edematous change. No significant hyperemia. Electronically Signed   By: Donavan Foil M.D.   On: 07/14/2022 18:13    Procedures Procedures    Medications Ordered in ED Medications  ketorolac (TORADOL) 15 MG/ML injection 15 mg (has no administration in time range)    ED Course/ Medical Decision Making/ A&P Clinical Course as of 07/14/22 1837  Thu Jul 14, 2022  1831 US SCROTUM W/DOPPLER I personally ordered and interpreted the study and do not see any evidence of torsion.  I do agree with radiologist interpretation. [CF]  1831 Urinalysis, Routine w reflex microscopic Urine, Clean Catch Negative. [CF]    Clinical Course User Index [CF] Hendricks Limes, PA-C                           Medical Decision Making Hector Kelley is a 76 y.o. male patient who presents to the emergency department today for further evaluation of right-sided testicle pain.  Given the physical exam I will likely get an ultrasound to further evaluate for torsion.  I will also suspicious for possible epididymitis.  We will also get a urine.  I will plan to reassess once labs result.  Patient is in no acute distress and not really complaining of ton of pain at this time.  Ultrasound is negative for torsion.  Urine is also clean.  I have a low suspicion for prostatitis and a lower suspicion for epididymitis after seeing the ultrasound.  I will give him some Toradol here and plan to discharge him home with naproxen and have him follow-up with urology.  Strict return precautions were discussed.  He is safe for discharge.   Amount and/or Complexity of Data Reviewed Labs: ordered. Decision-making details documented in ED Course. Radiology: ordered. Decision-making details documented in ED Course.  Risk Prescription drug management.   Final Clinical Impression(s) / ED  Diagnoses Final diagnoses:  Right testicular pain    Rx / DC Orders ED Discharge Orders          Ordered    naproxen (NAPROSYN) 375 MG tablet  2 times daily        07/14/22 1835              Hendricks Limes, PA-C 07/14/22 Vincente Liberty, MD 07/15/22 1325

## 2022-07-14 NOTE — Discharge Instructions (Signed)
Please take naproxen which is an anti-inflammatory as prescribed.  I would like for you to follow-up with urology for further evaluation.  Please return to the emergency department for any worsening symptoms you might have.

## 2022-07-14 NOTE — ED Triage Notes (Signed)
Pt sent from [facility] for c/o right testicle pain and swelling. Pt reports he went on a bike ride Tuesday and woke up the next morning with right lower abdominal pain and thought it was a pulled muscle.

## 2022-07-14 NOTE — ED Notes (Signed)
Patient transported to Ultrasound 

## 2022-07-14 NOTE — ED Notes (Signed)
Pt returned from US

## 2022-07-19 ENCOUNTER — Other Ambulatory Visit: Payer: Self-pay

## 2022-07-19 ENCOUNTER — Emergency Department (HOSPITAL_COMMUNITY)
Admission: EM | Admit: 2022-07-19 | Discharge: 2022-07-20 | Disposition: A | Discharge status: Home / Self Care | Payer: Medicare HMO | Source: Home / Self Care | Attending: Emergency Medicine | Admitting: Emergency Medicine

## 2022-07-19 ENCOUNTER — Emergency Department (HOSPITAL_COMMUNITY): Payer: Medicare HMO

## 2022-07-19 ENCOUNTER — Encounter (HOSPITAL_COMMUNITY): Payer: Self-pay

## 2022-07-19 DIAGNOSIS — Z79899 Other long term (current) drug therapy: Secondary | ICD-10-CM | POA: Insufficient documentation

## 2022-07-19 DIAGNOSIS — D72829 Elevated white blood cell count, unspecified: Secondary | ICD-10-CM | POA: Insufficient documentation

## 2022-07-19 DIAGNOSIS — M545 Low back pain, unspecified: Secondary | ICD-10-CM | POA: Insufficient documentation

## 2022-07-19 DIAGNOSIS — Z7982 Long term (current) use of aspirin: Secondary | ICD-10-CM | POA: Insufficient documentation

## 2022-07-19 DIAGNOSIS — A419 Sepsis, unspecified organism: Secondary | ICD-10-CM | POA: Diagnosis not present

## 2022-07-19 DIAGNOSIS — R7989 Other specified abnormal findings of blood chemistry: Secondary | ICD-10-CM | POA: Insufficient documentation

## 2022-07-19 DIAGNOSIS — N453 Epididymo-orchitis: Secondary | ICD-10-CM | POA: Insufficient documentation

## 2022-07-19 DIAGNOSIS — N5089 Other specified disorders of the male genital organs: Secondary | ICD-10-CM | POA: Diagnosis not present

## 2022-07-19 DIAGNOSIS — N452 Orchitis: Secondary | ICD-10-CM | POA: Diagnosis not present

## 2022-07-19 DIAGNOSIS — R109 Unspecified abdominal pain: Secondary | ICD-10-CM | POA: Diagnosis not present

## 2022-07-19 DIAGNOSIS — N451 Epididymitis: Secondary | ICD-10-CM | POA: Diagnosis not present

## 2022-07-19 DIAGNOSIS — R509 Fever, unspecified: Secondary | ICD-10-CM | POA: Diagnosis not present

## 2022-07-19 DIAGNOSIS — N433 Hydrocele, unspecified: Secondary | ICD-10-CM | POA: Diagnosis not present

## 2022-07-19 DIAGNOSIS — E039 Hypothyroidism, unspecified: Secondary | ICD-10-CM | POA: Insufficient documentation

## 2022-07-19 DIAGNOSIS — N50811 Right testicular pain: Secondary | ICD-10-CM | POA: Diagnosis not present

## 2022-07-19 LAB — BASIC METABOLIC PANEL
Anion gap: 9 (ref 5–15)
BUN: 18 mg/dL (ref 8–23)
CO2: 23 mmol/L (ref 22–32)
Calcium: 9 mg/dL (ref 8.9–10.3)
Chloride: 104 mmol/L (ref 98–111)
Creatinine, Ser: 1.27 mg/dL — ABNORMAL HIGH (ref 0.61–1.24)
GFR, Estimated: 58 mL/min — ABNORMAL LOW (ref >60)
Glucose, Bld: 118 mg/dL — ABNORMAL HIGH (ref 70–99)
Potassium: 3.1 mmol/L — ABNORMAL LOW (ref 3.5–5.1)
Sodium: 136 mmol/L (ref 135–145)

## 2022-07-19 LAB — URINALYSIS, ROUTINE W REFLEX MICROSCOPIC
Bilirubin Urine: NEGATIVE
Glucose, UA: NEGATIVE mg/dL
Hgb urine dipstick: NEGATIVE
Ketones, ur: 5 mg/dL — AB
Nitrite: NEGATIVE
Protein, ur: 100 mg/dL — AB
Specific Gravity, Urine: 1.024 (ref 1.005–1.030)
WBC, UA: >50 WBC/hpf — ABNORMAL HIGH (ref 0–5)
pH: 7 (ref 5.0–8.0)

## 2022-07-19 LAB — CBC
HCT: 40.8 % (ref 39.0–52.0)
Hemoglobin: 13.5 g/dL (ref 13.0–17.0)
MCH: 31.3 pg (ref 26.0–34.0)
MCHC: 33.1 g/dL (ref 30.0–36.0)
MCV: 94.7 fL (ref 80.0–100.0)
Platelets: 334 10*3/uL (ref 150–400)
RBC: 4.31 MIL/uL (ref 4.22–5.81)
RDW: 12.9 % (ref 11.5–15.5)
WBC: 25.9 10*3/uL — ABNORMAL HIGH (ref 4.0–10.5)
nRBC: 0 % (ref 0.0–0.2)

## 2022-07-19 MED ORDER — LEVOFLOXACIN 500 MG PO TABS
500.0000 mg | ORAL_TABLET | Freq: Once | ORAL | Status: AC
  Administered 2022-07-19: 500 mg via ORAL
  Filled 2022-07-19: qty 1

## 2022-07-19 MED ORDER — SODIUM CHLORIDE 0.9 % IV SOLN
1.0000 g | Freq: Once | INTRAVENOUS | Status: AC
  Administered 2022-07-19: 1 g via INTRAVENOUS
  Filled 2022-07-19: qty 10

## 2022-07-19 MED ORDER — LACTATED RINGERS IV BOLUS
1000.0000 mL | Freq: Once | INTRAVENOUS | Status: AC
  Administered 2022-07-20: 1000 mL via INTRAVENOUS

## 2022-07-19 MED ORDER — HYDROMORPHONE HCL 1 MG/ML IJ SOLN
0.5000 mg | Freq: Once | INTRAMUSCULAR | Status: AC
  Administered 2022-07-19: 0.5 mg via INTRAVENOUS
  Filled 2022-07-19: qty 1

## 2022-07-19 NOTE — ED Triage Notes (Signed)
Pt sent from [facility] in [place]. Reports worsening R testicle pain. He was seen a few das ago at [facility] and dx'd with epididymitis. Testicle has continued to swell and harden. Per UC provider, he has no cremasteric reflex. They were concerned for torsion.

## 2022-07-19 NOTE — ED Provider Notes (Signed)
[facility] DEPT Provider Note   CSN: [id number] Arrival date & time: 07/19/22  2002     History  Chief Complaint  Patient presents with   Testicle Pain    Hector Kelley is a 76 y.o. male.  HPI Patient has had right testicular pain for over a week.  He has had 2 emergency department visits and initially diagnosed with nonspecific inflammatory testicular pain after rule out of torsion and a negative urinalysis.  Patient reports he has been taking naproxen as prescribed.  He has gotten increased pain and swelling of the right testicle since starting treatment.  Denies any penile drainage or discharge.  He is occasionally sexually active with his wife but denies any other partners.  No fevers no chills.  Patient reports he has gotten some lower back pain since this started.  No pain or burning with urination.    Home Medications Prior to Admission medications   Medication Sig Start Date End Date Taking? Authorizing Provider  aspirin EC 81 MG tablet Take 81 mg by mouth daily. Swallow whole.   Yes [provider]  Carboxymethylcellulose Sodium (ARTIFICIAL TEARS OP) Place 1 drop into both eyes daily as needed (dry eyes).   Yes [provider]  cholecalciferol (VITAMIN D3) 25 MCG (1000 UNIT) tablet Take 1,000 Units by mouth daily.   Yes [provider]  glucosamine-chondroitin 500-400 MG tablet Take 1 tablet by mouth daily.   Yes [provider]  levothyroxine (SYNTHROID, LEVOTHROID) 125 MCG tablet Take 125 mcg by mouth daily before breakfast.   Yes [provider]  Multiple Vitamin (MULTIVITAMIN ADULT PO) Take 1 tablet by mouth daily.   Yes [provider]  naproxen (NAPROSYN) 375 MG tablet Take 1 tablet (375 mg total) by mouth 2 (two) times daily. 07/14/22  Yes Raul Del, Conner M, PA-C  HYDROcodone-acetaminophen (NORCO) 5-325 MG tablet Take 1-2 tablets by mouth every 6 (six) hours as needed for moderate pain  or severe pain. Patient not taking: Reported on 10/02/2020 02/14/19   Debbrah Alar, PA-C  HYDROcodone-acetaminophen (NORCO/VICODIN) 5-325 MG tablet Take 1 tablet by mouth every 6 (six) hours as needed for moderate pain or severe pain. 07/20/22  Yes Charlesetta Shanks, MD  levofloxacin (LEVAQUIN) 500 MG tablet Take 1 tablet (500 mg total) by mouth daily. 07/20/22  Yes Charlesetta Shanks, MD      Allergies    Patient has no known allergies.    Review of Systems   Review of Systems  Physical Exam Updated Vital Signs BP 106/72   Pulse 91   Temp 98 F (36.7 C)   Resp 18   Ht 6' (1.829 m)   Wt 93 kg   SpO2 98%   BMI 27.80 kg/m  Physical Exam Constitutional:      Comments: Alert nontoxic.  No respiratory distress.  Clear mental status.  HENT:     Mouth/Throat:     Pharynx: Oropharynx is clear.  Eyes:     Extraocular Movements: Extraocular movements intact.  Pulmonary:     Effort: Pulmonary effort is normal.  Abdominal:     General: There is no distension.     Palpations: Abdomen is soft.     Tenderness: There is no abdominal tenderness. There is no guarding.  Genitourinary:    Comments: Right testicle firm, tender and enlarged.  Epididymis tender and enlarged. Musculoskeletal:        General: Normal range of motion.  Skin:    General: Skin is warm  and dry.  Neurological:     General: No focal deficit present.     Mental Status: He is oriented to person, place, and time.     ED Results / Procedures / Treatments   Labs (all labs ordered are listed, but only abnormal results are displayed) Labs Reviewed  URINALYSIS, ROUTINE W REFLEX MICROSCOPIC - Abnormal; Notable for the following components:      Result Value   APPearance HAZY (*)    Ketones, ur 5 (*)    Protein, ur 100 (*)    Leukocytes,Ua MODERATE (*)    WBC, UA >50 (*)    Bacteria, UA RARE (*)    All other components within normal limits  BASIC METABOLIC PANEL - Abnormal; Notable for the following components:    Potassium 3.1 (*)    Glucose, Bld 118 (*)    Creatinine, Ser 1.27 (*)    GFR, Estimated 58 (*)    All other components within normal limits  CBC - Abnormal; Notable for the following components:   WBC 25.9 (*)    All other components within normal limits  URINE CULTURE  RPR  GC/CHLAMYDIA PROBE AMP ([place]) NOT AT [facility] [place]    EKG None  Radiology US SCROTUM W/DOPPLER  Result Date: 07/19/2022 CLINICAL DATA:  Right testicular pain. EXAM: SCROTAL ULTRASOUND DOPPLER ULTRASOUND OF THE TESTICLES TECHNIQUE: Complete ultrasound examination of the testicles, epididymis, and other scrotal structures was performed. Color and spectral Doppler ultrasound were also utilized to evaluate blood flow to the testicles. COMPARISON:  Ultrasound dated 07/14/2022. FINDINGS: Right testicle Measurements: 4.9 x 3.8 x 3.5 cm. The right testicle is heterogeneous and slightly hypervascular. No discrete mass. Left testicle Measurements: 4.2 x 2.8 x 3.3 cm. The left testicle is unremarkable. Right epididymis: The right epididymis is enlarged, heterogeneous, and hypervascular. Left epididymis:  Normal in size and appearance. Hydrocele:  Right-sided septated hydrocele. Varicocele:  None visualized. Pulsed Doppler interrogation of both testes demonstrates normal low resistance arterial and venous waveforms bilaterally. IMPRESSION: 1. Right-sided epididymo-orchitis. Correlation with clinical exam and urinalysis recommended. 2. Septated right pyocele. Electronically Signed   By: Anner Crete M.D.   On: 07/19/2022 21:00    Procedures Procedures    Medications Ordered in ED Medications  HYDROmorphone (DILAUDID) injection 0.5 mg (0.5 mg Intravenous Given 07/19/22 2222)  cefTRIAXone (ROCEPHIN) 1 g in sodium chloride 0.9 % 100 mL IVPB (0 g Intravenous Stopped 07/19/22 2254)  levofloxacin (LEVAQUIN) tablet 500 mg (500 mg Oral Given 07/19/22 2226)  lactated ringers bolus 1,000 mL (1,000 mLs Intravenous New Bag/Given 07/20/22  0002)    ED Course/ Medical Decision Making/ A&P                           Medical Decision Making Amount and/or Complexity of Data Reviewed Labs: ordered.  Risk Prescription drug management.   Patient has had over a week of right testicular swelling and pain.  Symptoms have progressed since starting NSAIDs.  We will proceed with ultrasound and add diagnostic lab work.  Patient has significant leukocytosis at 25,000.  Urinalysis is positive for greater than 50 WBCs and leuk esterase.  Mild elevation in creatinine from baseline 1.27.  Radiology interpretation of ultrasound positive for orchitis epididymitis with pyocele.  Patient reassessed after 1 g Rocephin and Levaquin as well as half milligram Dilaudid.  He reports he feels much improved.  He is sitting up at bedside.  Consult: Reviewed with urology resident Dr.  Mike Emmerling.  He will evaluate the patient in the emergency department.  Final disposition will be per Dr. Arita Miss evaluation.  At this time patient is not febrile, has clear mental status, no respiratory distress and nonsurgical abdomen.        Final Clinical Impression(s) / ED Diagnoses Final diagnoses:  Epididymo-orchitis    Rx / DC Orders ED Discharge Orders          Ordered    HYDROcodone-acetaminophen (NORCO/VICODIN) 5-325 MG tablet  Every 6 hours PRN        07/20/22 0009    levofloxacin (LEVAQUIN) 500 MG tablet  Daily        07/20/22 0009              Charlesetta Shanks, MD 07/20/22 0010

## 2022-07-19 NOTE — ED Provider Notes (Incomplete)
[facility] DEPT Provider Note   CSN: [id number] Arrival date & time: 07/19/22  2002     History {Add pertinent medical, surgical, social history, OB history to HPI:1} Chief Complaint  Patient presents with  . Testicle Pain    Hector Kelley is a 76 y.o. male.  HPI Patient has had right testicular pain for over a week.  He has had 2 emergency department visits and initially diagnosed with nonspecific inflammatory testicular pain after rule out of torsion and a negative urinalysis.  Patient reports he has been taking naproxen as prescribed.  He has gotten increased pain and swelling of the right testicle since starting treatment.  Denies any penile drainage or discharge.  He is occasionally sexually active with his wife but denies any other partners.  No fevers no chills.  Patient reports he has gotten some lower back pain since this started.  No pain or burning with urination.    Home Medications Prior to Admission medications   Medication Sig Start Date End Date Taking? Authorizing Provider  Carboxymethylcellulose Sodium (ARTIFICIAL TEARS OP) Place 1 drop into both eyes daily as needed (dry eyes).    [provider]  cetirizine (ZYRTEC) 10 MG tablet Take 10 mg by mouth daily as needed for allergies. Patient not taking: Reported on 10/02/2020    [provider]  finasteride (PROSCAR) 5 MG tablet Take 5 mg by mouth daily after supper.     [provider]  HYDROcodone-acetaminophen (NORCO) 5-325 MG tablet Take 1-2 tablets by mouth every 6 (six) hours as needed for moderate pain or severe pain. Patient not taking: Reported on 10/02/2020 02/14/19   Debbrah Alar, PA-C  levothyroxine (SYNTHROID, LEVOTHROID) 125 MCG tablet Take 125 mcg by mouth daily before breakfast.    [provider]  naproxen (NAPROSYN) 375 MG tablet Take 1 tablet (375 mg total) by mouth 2 (two) times daily. 07/14/22   Hendricks Limes, PA-C       Allergies    Patient has no known allergies.    Review of Systems   Review of Systems  Physical Exam Updated Vital Signs BP 106/72   Pulse 91   Temp 98 F (36.7 C)   Resp 18   Ht 6' (1.829 m)   Wt 93 kg   SpO2 98%   BMI 27.80 kg/m  Physical Exam Constitutional:      Comments: Alert nontoxic.  No respiratory distress.  Clear mental status.     ED Results / Procedures / Treatments   Labs (all labs ordered are listed, but only abnormal results are displayed) Labs Reviewed  URINALYSIS, ROUTINE W REFLEX MICROSCOPIC - Abnormal; Notable for the following components:      Result Value   APPearance HAZY (*)    Ketones, ur 5 (*)    Protein, ur 100 (*)    Leukocytes,Ua MODERATE (*)    WBC, UA >50 (*)    Bacteria, UA RARE (*)    All other components within normal limits  BASIC METABOLIC PANEL - Abnormal; Notable for the following components:   Potassium 3.1 (*)    Glucose, Bld 118 (*)    Creatinine, Ser 1.27 (*)    GFR, Estimated 58 (*)    All other components within normal limits  CBC - Abnormal; Notable for the following components:   WBC 25.9 (*)    All other components within normal limits  URINE CULTURE  RPR  GC/CHLAMYDIA PROBE AMP ([place]) NOT AT [facility]  EKG None  Radiology US SCROTUM W/DOPPLER  Result Date: 07/19/2022 CLINICAL DATA:  Right testicular pain. EXAM: SCROTAL ULTRASOUND DOPPLER ULTRASOUND OF THE TESTICLES TECHNIQUE: Complete ultrasound examination of the testicles, epididymis, and other scrotal structures was performed. Color and spectral Doppler ultrasound were also utilized to evaluate blood flow to the testicles. COMPARISON:  Ultrasound dated 07/14/2022. FINDINGS: Right testicle Measurements: 4.9 x 3.8 x 3.5 cm. The right testicle is heterogeneous and slightly hypervascular. No discrete mass. Left testicle Measurements: 4.2 x 2.8 x 3.3 cm. The left testicle is unremarkable. Right epididymis: The right epididymis is enlarged, heterogeneous,  and hypervascular. Left epididymis:  Normal in size and appearance. Hydrocele:  Right-sided septated hydrocele. Varicocele:  None visualized. Pulsed Doppler interrogation of both testes demonstrates normal low resistance arterial and venous waveforms bilaterally. IMPRESSION: 1. Right-sided epididymo-orchitis. Correlation with clinical exam and urinalysis recommended. 2. Septated right pyocele. Electronically Signed   By: Anner Crete M.D.   On: 07/19/2022 21:00    Procedures Procedures  {Document cardiac monitor, telemetry assessment procedure when appropriate:1}  Medications Ordered in ED Medications  HYDROmorphone (DILAUDID) injection 0.5 mg (0.5 mg Intravenous Given 07/19/22 2222)  cefTRIAXone (ROCEPHIN) 1 g in sodium chloride 0.9 % 100 mL IVPB (0 g Intravenous Stopped 07/19/22 2254)  levofloxacin (LEVAQUIN) tablet 500 mg (500 mg Oral Given 07/19/22 2226)    ED Course/ Medical Decision Making/ A&P                           Medical Decision Making Amount and/or Complexity of Data Reviewed Labs: ordered.  Risk Prescription drug management.   ***  {Document critical care time when appropriate:1} {Document review of labs and clinical decision tools ie heart score, Chads2Vasc2 etc:1}  {Document your independent review of radiology images, and any outside records:1} {Document your discussion with family members, caretakers, and with consultants:1} {Document social determinants of health affecting pt's care:1} {Document your decision making why or why not admission, treatments were needed:1} Final Clinical Impression(s) / ED Diagnoses Final diagnoses:  None    Rx / DC Orders ED Discharge Orders     None

## 2022-07-19 NOTE — ED Triage Notes (Signed)
Pt complaining of pain in the rt testicle that started last Tuesday.  Has been to the hospital for a ultrasound last Wednesday and at that time it was not torsed.  Pain is not getting better.

## 2022-07-20 ENCOUNTER — Emergency Department (HOSPITAL_COMMUNITY): Payer: Medicare HMO

## 2022-07-20 ENCOUNTER — Inpatient Hospital Stay (HOSPITAL_COMMUNITY)
Admission: EM | Admit: 2022-07-20 | Discharge: 2022-07-23 | DRG: 872 | Disposition: A | Discharge status: Home / Self Care | Payer: Medicare HMO | Source: Ambulatory Visit | Attending: Internal Medicine | Admitting: Internal Medicine

## 2022-07-20 ENCOUNTER — Encounter (HOSPITAL_COMMUNITY): Payer: Self-pay

## 2022-07-20 DIAGNOSIS — Z8616 Personal history of COVID-19: Secondary | ICD-10-CM

## 2022-07-20 DIAGNOSIS — R112 Nausea with vomiting, unspecified: Secondary | ICD-10-CM | POA: Diagnosis present

## 2022-07-20 DIAGNOSIS — N451 Epididymitis: Secondary | ICD-10-CM | POA: Diagnosis not present

## 2022-07-20 DIAGNOSIS — Z79890 Hormone replacement therapy (postmenopausal): Secondary | ICD-10-CM | POA: Diagnosis not present

## 2022-07-20 DIAGNOSIS — Z7982 Long term (current) use of aspirin: Secondary | ICD-10-CM

## 2022-07-20 DIAGNOSIS — Z9079 Acquired absence of other genital organ(s): Secondary | ICD-10-CM

## 2022-07-20 DIAGNOSIS — Z1152 Encounter for screening for COVID-19: Secondary | ICD-10-CM | POA: Diagnosis not present

## 2022-07-20 DIAGNOSIS — I959 Hypotension, unspecified: Secondary | ICD-10-CM

## 2022-07-20 DIAGNOSIS — E861 Hypovolemia: Secondary | ICD-10-CM

## 2022-07-20 DIAGNOSIS — Z79899 Other long term (current) drug therapy: Secondary | ICD-10-CM | POA: Diagnosis not present

## 2022-07-20 DIAGNOSIS — E039 Hypothyroidism, unspecified: Secondary | ICD-10-CM | POA: Diagnosis not present

## 2022-07-20 DIAGNOSIS — N452 Orchitis: Secondary | ICD-10-CM | POA: Diagnosis not present

## 2022-07-20 DIAGNOSIS — R319 Hematuria, unspecified: Secondary | ICD-10-CM | POA: Diagnosis present

## 2022-07-20 DIAGNOSIS — Z791 Long term (current) use of non-steroidal anti-inflammatories (NSAID): Secondary | ICD-10-CM | POA: Diagnosis not present

## 2022-07-20 DIAGNOSIS — R809 Proteinuria, unspecified: Secondary | ICD-10-CM | POA: Diagnosis present

## 2022-07-20 DIAGNOSIS — I517 Cardiomegaly: Secondary | ICD-10-CM | POA: Diagnosis not present

## 2022-07-20 DIAGNOSIS — N179 Acute kidney failure, unspecified: Secondary | ICD-10-CM | POA: Diagnosis not present

## 2022-07-20 DIAGNOSIS — Z9049 Acquired absence of other specified parts of digestive tract: Secondary | ICD-10-CM

## 2022-07-20 DIAGNOSIS — N50811 Right testicular pain: Secondary | ICD-10-CM

## 2022-07-20 DIAGNOSIS — I9589 Other hypotension: Secondary | ICD-10-CM | POA: Diagnosis not present

## 2022-07-20 DIAGNOSIS — N453 Epididymo-orchitis: Secondary | ICD-10-CM | POA: Diagnosis not present

## 2022-07-20 DIAGNOSIS — N200 Calculus of kidney: Secondary | ICD-10-CM | POA: Diagnosis not present

## 2022-07-20 DIAGNOSIS — D649 Anemia, unspecified: Secondary | ICD-10-CM | POA: Diagnosis present

## 2022-07-20 DIAGNOSIS — I700 Atherosclerosis of aorta: Secondary | ICD-10-CM | POA: Diagnosis not present

## 2022-07-20 DIAGNOSIS — K5730 Diverticulosis of large intestine without perforation or abscess without bleeding: Secondary | ICD-10-CM | POA: Diagnosis not present

## 2022-07-20 DIAGNOSIS — A419 Sepsis, unspecified organism: Secondary | ICD-10-CM | POA: Diagnosis not present

## 2022-07-20 DIAGNOSIS — U071 COVID-19: Secondary | ICD-10-CM | POA: Diagnosis not present

## 2022-07-20 LAB — RESP PANEL BY RT-PCR (FLU A&B, COVID) ARPGX2
Influenza A by PCR: NEGATIVE
Influenza B by PCR: NEGATIVE
SARS Coronavirus 2 by RT PCR: POSITIVE — AB

## 2022-07-20 LAB — URINALYSIS, ROUTINE W REFLEX MICROSCOPIC
Bilirubin Urine: NEGATIVE
Glucose, UA: NEGATIVE mg/dL
Ketones, ur: NEGATIVE mg/dL
Nitrite: NEGATIVE
Protein, ur: 100 mg/dL — AB
RBC / HPF: >50 RBC/hpf — ABNORMAL HIGH (ref 0–5)
Specific Gravity, Urine: 1.03 (ref 1.005–1.030)
pH: 5 (ref 5.0–8.0)

## 2022-07-20 LAB — BASIC METABOLIC PANEL
Anion gap: 9 (ref 5–15)
BUN: 27 mg/dL — ABNORMAL HIGH (ref 8–23)
CO2: 21 mmol/L — ABNORMAL LOW (ref 22–32)
Calcium: 8.9 mg/dL (ref 8.9–10.3)
Chloride: 104 mmol/L (ref 98–111)
Creatinine, Ser: 1.28 mg/dL — ABNORMAL HIGH (ref 0.61–1.24)
GFR, Estimated: 57 mL/min — ABNORMAL LOW (ref >60)
Glucose, Bld: 118 mg/dL — ABNORMAL HIGH (ref 70–99)
Potassium: 3.8 mmol/L (ref 3.5–5.1)
Sodium: 134 mmol/L — ABNORMAL LOW (ref 135–145)

## 2022-07-20 LAB — GC/CHLAMYDIA PROBE AMP (~~LOC~~) NOT AT ARMC
Chlamydia: NEGATIVE
Comment: NEGATIVE
Comment: NORMAL
Neisseria Gonorrhea: NEGATIVE

## 2022-07-20 LAB — LACTIC ACID, PLASMA
Lactic Acid, Venous: 1.6 mmol/L (ref 0.5–1.9)
Lactic Acid, Venous: 1.6 mmol/L (ref 0.5–1.9)

## 2022-07-20 LAB — CBC WITH DIFFERENTIAL/PLATELET
Abs Immature Granulocytes: 2.42 10*3/uL — ABNORMAL HIGH (ref 0.00–0.07)
Basophils Absolute: 0.1 10*3/uL (ref 0.0–0.1)
Basophils Relative: 0 %
Eosinophils Absolute: 0 10*3/uL (ref 0.0–0.5)
Eosinophils Relative: 0 %
HCT: 37.8 % — ABNORMAL LOW (ref 39.0–52.0)
Hemoglobin: 12.6 g/dL — ABNORMAL LOW (ref 13.0–17.0)
Immature Granulocytes: 7 %
Lymphocytes Relative: 3 %
Lymphs Abs: 1 10*3/uL (ref 0.7–4.0)
MCH: 31.2 pg (ref 26.0–34.0)
MCHC: 33.3 g/dL (ref 30.0–36.0)
MCV: 93.6 fL (ref 80.0–100.0)
Monocytes Absolute: 4.1 10*3/uL — ABNORMAL HIGH (ref 0.1–1.0)
Monocytes Relative: 12 %
Neutro Abs: 26.6 10*3/uL — ABNORMAL HIGH (ref 1.7–7.7)
Neutrophils Relative %: 78 %
Platelets: 321 10*3/uL (ref 150–400)
RBC: 4.04 MIL/uL — ABNORMAL LOW (ref 4.22–5.81)
RDW: 13.2 % (ref 11.5–15.5)
WBC Morphology: INCREASED
WBC: 34.2 10*3/uL — ABNORMAL HIGH (ref 4.0–10.5)
nRBC: 0 % (ref 0.0–0.2)

## 2022-07-20 LAB — RPR: RPR Ser Ql: NONREACTIVE

## 2022-07-20 MED ORDER — HYDROCODONE-ACETAMINOPHEN 5-325 MG PO TABS: 1.0000 | ORAL_TABLET | Freq: Four times a day (QID) | ORAL | Status: DC | PRN

## 2022-07-20 MED ORDER — LACTATED RINGERS IV BOLUS
1000.0000 mL | Freq: Once | INTRAVENOUS | Status: AC
  Administered 2022-07-20: 1000 mL via INTRAVENOUS

## 2022-07-20 MED ORDER — VITAMIN D 25 MCG (1000 UNIT) PO TABS
1000.0000 [IU] | ORAL_TABLET | Freq: Every day | ORAL | Status: DC
  Administered 2022-07-21 – 2022-07-22 (×2): 1000 [IU] via ORAL
  Filled 2022-07-20 (×3): qty 1

## 2022-07-20 MED ORDER — HYDROCODONE-ACETAMINOPHEN 5-325 MG PO TABS: 1.0000 | ORAL_TABLET | Freq: Four times a day (QID) | ORAL | 0 refills | Status: DC | PRN

## 2022-07-20 MED ORDER — CIPROFLOXACIN IN D5W 400 MG/200ML IV SOLN
400.0000 mg | Freq: Two times a day (BID) | INTRAVENOUS | Status: DC
  Administered 2022-07-21 – 2022-07-22 (×4): 400 mg via INTRAVENOUS
  Filled 2022-07-20 (×4): qty 200

## 2022-07-20 MED ORDER — ONDANSETRON HCL 4 MG/2ML IJ SOLN
4.0000 mg | Freq: Once | INTRAMUSCULAR | Status: AC
  Administered 2022-07-20: 4 mg via INTRAVENOUS
  Filled 2022-07-20: qty 2

## 2022-07-20 MED ORDER — LEVOTHYROXINE SODIUM 125 MCG PO TABS
125.0000 ug | ORAL_TABLET | Freq: Every day | ORAL | Status: DC
  Administered 2022-07-21 – 2022-07-22 (×2): 125 ug via ORAL
  Filled 2022-07-20 (×2): qty 1

## 2022-07-20 MED ORDER — LEVOFLOXACIN 500 MG PO TABS: 500.0000 mg | ORAL_TABLET | Freq: Every day | ORAL | 0 refills | Status: DC

## 2022-07-20 MED ORDER — IOHEXOL 300 MG/ML  SOLN
100.0000 mL | Freq: Once | INTRAMUSCULAR | Status: AC | PRN
  Administered 2022-07-20: 100 mL via INTRAVENOUS

## 2022-07-20 MED ORDER — LACTATED RINGERS IV SOLN: INTRAVENOUS | Status: AC

## 2022-07-20 MED ORDER — DOXYCYCLINE HYCLATE 100 MG PO CAPS: 100.0000 mg | ORAL_CAPSULE | Freq: Two times a day (BID) | ORAL | 0 refills | Status: DC

## 2022-07-20 MED ORDER — ENOXAPARIN SODIUM 40 MG/0.4ML IJ SOSY
40.0000 mg | PREFILLED_SYRINGE | INTRAMUSCULAR | Status: DC
  Administered 2022-07-21 – 2022-07-22 (×3): 40 mg via SUBCUTANEOUS
  Filled 2022-07-20 (×3): qty 0.4

## 2022-07-20 MED ORDER — ASPIRIN 81 MG PO TBEC
81.0000 mg | DELAYED_RELEASE_TABLET | Freq: Every day | ORAL | Status: DC
  Administered 2022-07-21 – 2022-07-22 (×2): 81 mg via ORAL
  Filled 2022-07-20 (×3): qty 1

## 2022-07-20 MED ORDER — CIPROFLOXACIN IN D5W 400 MG/200ML IV SOLN
400.0000 mg | Freq: Once | INTRAVENOUS | Status: AC
  Administered 2022-07-20: 400 mg via INTRAVENOUS
  Filled 2022-07-20: qty 200

## 2022-07-20 NOTE — Assessment & Plan Note (Signed)
Continue levothyroxine 

## 2022-07-20 NOTE — ED Triage Notes (Signed)
Pt arrived via POV, c/o pain to right testicle. Was seen yesterday and told to return with any worsening issues. Pt endorses worsening pain, blood in urine and feeling chilled.

## 2022-07-20 NOTE — Assessment & Plan Note (Signed)
BP soft around SBP 90s. Continue IV fluids overnight.

## 2022-07-20 NOTE — Discharge Instructions (Addendum)
1.  Follow-up with urology as soon as possible for recheck. 2.  Take antibiotics (doxycycline) as prescribed. 3.  Take 1-2 Vicodin tablets every 6 hours as needed for pain control.  Hydrate well.  4.  Return to emergency department immediately if you have new or worsening symptoms

## 2022-07-20 NOTE — Assessment & Plan Note (Signed)
Epididymal orchitis  -last ultrasound 07/19/2022 rule out torsion -repeat CT A/P without any significant change in right sided orchitis -Gonorrhea and Chlamydia tests negative -UA is not grossly positive. Urine culture pending -continue IV Cipro  -urology consulted but no further recommendations for overnight

## 2022-07-20 NOTE — Assessment & Plan Note (Signed)
-  creatinine elevated at 1.29 -continuous IV fluids overnight and follow -avoid nephrotoxic agents

## 2022-07-20 NOTE — ED Provider Notes (Signed)
[facility] DEPT Provider Note   CSN: [id number] Arrival date & time: 07/20/22  1612     History  Chief Complaint  Patient presents with   Testicle Pain    Hector Kelley is a 76 y.o. male.   Testicle Pain     76 year old male with a recent presentation to the emergency department and diagnosis of orchitis, seen by urology and cleared for discharge last night who presents with continued pain and swelling of the right testicle now developing low-grade fevers and chills at home with additional concern for nausea and vomiting and inability to tolerate oral intake.  He endorses hematuria.  He endorses continued sharp pain in his right testicle.  Recommendations for discharge last night were doxycycline twice daily for 2 weeks.  Given the patient's vomiting, he has not been able to tolerate his oral antibiotic.  Home Medications Prior to Admission medications   Medication Sig Start Date End Date Taking? Authorizing Provider  aspirin EC 81 MG tablet Take 81 mg by mouth daily. Swallow whole.   Yes [provider]  Carboxymethylcellulose Sodium (ARTIFICIAL TEARS OP) Place 1 drop into both eyes daily as needed (dry eyes).   Yes [provider]  cholecalciferol (VITAMIN D3) 25 MCG (1000 UNIT) tablet Take 1,000 Units by mouth daily.   Yes [provider]  doxycycline (VIBRAMYCIN) 100 MG capsule Take 1 capsule (100 mg total) by mouth 2 (two) times daily for 14 days. 07/20/22 08/03/22 Yes Quintella Reichert, MD  glucosamine-chondroitin 500-400 MG tablet Take 1 tablet by mouth daily.   Yes [provider]  HYDROcodone-acetaminophen (NORCO/VICODIN) 5-325 MG tablet Take 1 tablet by mouth every 6 (six) hours as needed for moderate pain or severe pain. 07/20/22  Yes Pfeiffer, Jeannie Done, MD  levothyroxine (SYNTHROID, LEVOTHROID) 125 MCG tablet Take 125 mcg by mouth daily before breakfast.   Yes [provider]  Multiple Vitamin  (MULTIVITAMIN ADULT PO) Take 1 tablet by mouth daily.   Yes [provider]  naproxen (NAPROSYN) 375 MG tablet Take 1 tablet (375 mg total) by mouth 2 (two) times daily. 07/14/22  Yes Raul Del, Conner M, PA-C  HYDROcodone-acetaminophen (NORCO) 5-325 MG tablet Take 1-2 tablets by mouth every 6 (six) hours as needed for moderate pain or severe pain. Patient not taking: Reported on 10/02/2020 02/14/19   Debbrah Alar, PA-C  levofloxacin (LEVAQUIN) 500 MG tablet Take 500 mg by mouth daily. Patient not taking: Reported on 07/20/2022 07/20/22   [provider]      Allergies    Patient has no known allergies.    Review of Systems   Review of Systems  Constitutional:  Positive for chills.  Gastrointestinal:  Positive for nausea and vomiting.  Genitourinary:  Positive for testicular pain.  All other systems reviewed and are negative.   Physical Exam Updated Vital Signs BP 98/65   Pulse 80   Temp 98.9 F (37.2 C) (Oral)   Resp 19   Ht 6' (1.829 m)   Wt 93 kg   SpO2 94%   BMI 27.81 kg/m  Physical Exam Vitals and nursing note reviewed. Exam conducted with a chaperone present.  Constitutional:      General: He is not in acute distress. HENT:     Head: Normocephalic and atraumatic.  Eyes:     Conjunctiva/sclera: Conjunctivae normal.     Pupils: Pupils are equal, round, and reactive to light.  Cardiovascular:     Rate and Rhythm: Normal rate and regular rhythm.  Pulmonary:     Effort: Pulmonary effort is normal. No respiratory distress.  Abdominal:     General: There is no distension.     Tenderness: There is no guarding.  Genitourinary:    Comments: Right testicle severely swollen and painful to the touch Musculoskeletal:        General: No deformity or signs of injury.     Cervical back: Neck supple.  Skin:    Findings: No lesion or rash.  Neurological:     General: No focal deficit present.     Mental Status: He is alert. Mental status is at baseline.      ED Results / Procedures / Treatments   Labs (all labs ordered are listed, but only abnormal results are displayed) Labs Reviewed  CBC WITH DIFFERENTIAL/PLATELET - Abnormal; Notable for the following components:      Result Value   WBC 34.2 (*)    RBC 4.04 (*)    Hemoglobin 12.6 (*)    HCT 37.8 (*)    Neutro Abs 26.6 (*)    Monocytes Absolute 4.1 (*)    Abs Immature Granulocytes 2.42 (*)    All other components within normal limits  BASIC METABOLIC PANEL - Abnormal; Notable for the following components:   Sodium 134 (*)    CO2 21 (*)    Glucose, Bld 118 (*)    BUN 27 (*)    Creatinine, Ser 1.28 (*)    GFR, Estimated 57 (*)    All other components within normal limits  URINALYSIS, ROUTINE W REFLEX MICROSCOPIC - Abnormal; Notable for the following components:   APPearance HAZY (*)    Hgb urine dipstick LARGE (*)    Protein, ur 100 (*)    Leukocytes,Ua SMALL (*)    RBC / HPF >50 (*)    Bacteria, UA FEW (*)    All other components within normal limits  URINE CULTURE  CULTURE, BLOOD (ROUTINE X 2)  CULTURE, BLOOD (ROUTINE X 2)  RESP PANEL BY RT-PCR (FLU A&B, COVID) ARPGX2  LACTIC ACID, PLASMA  LACTIC ACID, PLASMA  CBC  BASIC METABOLIC PANEL    EKG None  Radiology CT ABDOMEN PELVIS W CONTRAST  Result Date: 07/20/2022 CLINICAL DATA:  Hernia. EXAM: CT ABDOMEN AND PELVIS WITH CONTRAST TECHNIQUE: Multidetector CT imaging of the abdomen and pelvis was performed using the standard protocol following bolus administration of intravenous contrast. RADIATION DOSE REDUCTION: This exam was performed according to the departmental dose-optimization program which includes automated exposure control, adjustment of the mA and/or kV according to patient size and/or use of iterative reconstruction technique. CONTRAST:  131m OMNIPAQUE IOHEXOL 300 MG/ML  SOLN COMPARISON:  None Available. FINDINGS: Lower chest: Bibasilar streaky atelectasis/scarring. No intra-abdominal free air or free  fluid. Hepatobiliary: Fatty liver. The biliary dilatation. Cholecystectomy. No retained calcified stone noted in the central CBD. Pancreas: Unremarkable. No pancreatic ductal dilatation or surrounding inflammatory changes. Spleen: Normal in size without focal abnormality. Adrenals/Urinary Tract: The adrenal glands unremarkable there is a 3 mm nonobstructing left renal inferior pole calculus. Subcentimeter left renal upper pole hypodense focus is too small to characterize. There is no hydronephrosis on either side. There is symmetric enhancement and excretion of contrast by both kidneys. The visualized ureters and urinary bladder appear unremarkable. Stomach/Bowel: There is a 3.5 cm diverticulum from the gastric fundus. There is sigmoid diverticulosis without active inflammatory changes. There is no bowel obstruction or active inflammation. The appendix is normal. Vascular/Lymphatic: Moderate aortoiliac atherosclerotic disease. The IVC is  unremarkable. No pelvic mass. There is no adenopathy. Reproductive: The prostate and seminal vesicles are grossly remarkable. Apparent post procedural changes of TURP. Other: There is inflammatory changes of the visualized right scrotal wall and mild inflammatory changes of the spermatic cord in the right inguinal canal. Findings in keeping with right-sided epididymal orchitis seen on the ultrasound of 07/19/2022. Musculoskeletal: Degenerative changes of the spine. No acute osseous pathology. IMPRESSION: 1. Findings of right-sided orchitis. 2. Sigmoid diverticulosis. No bowel obstruction. Normal appendix. 3. A 3 mm nonobstructing left renal inferior pole calculus. No hydronephrosis. 4. Fatty liver. 5.  Aortic Atherosclerosis (ICD10-I70.0). Electronically Signed   By: Anner Crete M.D.   On: 07/20/2022 20:00   US SCROTUM W/DOPPLER  Result Date: 07/19/2022 CLINICAL DATA:  Right testicular pain. EXAM: SCROTAL ULTRASOUND DOPPLER ULTRASOUND OF THE TESTICLES TECHNIQUE: Complete  ultrasound examination of the testicles, epididymis, and other scrotal structures was performed. Color and spectral Doppler ultrasound were also utilized to evaluate blood flow to the testicles. COMPARISON:  Ultrasound dated 07/14/2022. FINDINGS: Right testicle Measurements: 4.9 x 3.8 x 3.5 cm. The right testicle is heterogeneous and slightly hypervascular. No discrete mass. Left testicle Measurements: 4.2 x 2.8 x 3.3 cm. The left testicle is unremarkable. Right epididymis: The right epididymis is enlarged, heterogeneous, and hypervascular. Left epididymis:  Normal in size and appearance. Hydrocele:  Right-sided septated hydrocele. Varicocele:  None visualized. Pulsed Doppler interrogation of both testes demonstrates normal low resistance arterial and venous waveforms bilaterally. IMPRESSION: 1. Right-sided epididymo-orchitis. Correlation with clinical exam and urinalysis recommended. 2. Septated right pyocele. Electronically Signed   By: Anner Crete M.D.   On: 07/19/2022 21:00    Procedures .Critical Care  Performed by: Regan Lemming, MD Authorized by: Regan Lemming, MD   Critical care provider statement:    Critical care time (minutes):  30   Critical care was necessary to treat or prevent imminent or life-threatening deterioration of the following conditions:  Sepsis   Critical care was time spent personally by me on the following activities:  Development of treatment plan with patient or surrogate, discussions with consultants, evaluation of patient's response to treatment, examination of patient, ordering and review of laboratory studies, ordering and review of radiographic studies, ordering and performing treatments and interventions, pulse oximetry, re-evaluation of patient's condition and review of old charts   Care discussed with: admitting provider       Medications Ordered in ED Medications  lactated ringers infusion ( Intravenous New Bag/Given 07/20/22 2035)  enoxaparin (LOVENOX)  injection 40 mg (has no administration in time range)  levothyroxine (SYNTHROID) tablet 125 mcg (has no administration in time range)  ciprofloxacin (CIPRO) IVPB 400 mg (has no administration in time range)  aspirin EC tablet 81 mg (has no administration in time range)  HYDROcodone-acetaminophen (NORCO/VICODIN) 5-325 MG per tablet 1 tablet (has no administration in time range)  cholecalciferol (VITAMIN D3) 25 MCG (1000 UNIT) tablet 1,000 Units (has no administration in time range)  lactated ringers bolus 1,000 mL (0 mLs Intravenous Stopped 07/20/22 1941)  ciprofloxacin (CIPRO) IVPB 400 mg (0 mg Intravenous Stopped 07/20/22 1941)  ondansetron (ZOFRAN) injection 4 mg (4 mg Intravenous Given 07/20/22 1816)  lactated ringers bolus 1,000 mL (0 mLs Intravenous Stopped 07/20/22 2250)  iohexol (OMNIPAQUE) 300 MG/ML solution 100 mL (100 mLs Intravenous Contrast Given 07/20/22 1938)    ED Course/ Medical Decision Making/ A&P Clinical Course as of 07/20/22 2253  Wed Jul 20, 2022  1920 WBC(!): 34.2 [JL]  1920 Creatinine(!): 1.28 [JL]  2004 Chalmers GuestMarland Kitchen): SMALL [JL]  2004 Bacteria, UA(!): FEW [JL]  2004 WBC, UA: 21-50 [JL]    Clinical Course User Index [JL] Regan Lemming, MD                           Medical Decision Making Amount and/or Complexity of Data Reviewed Labs: ordered. Decision-making details documented in ED Course. Radiology: ordered.  Risk Prescription drug management. Decision regarding hospitalization.    76 year old male with a recent presentation to the emergency department and diagnosis of orchitis, seen by urology and cleared for discharge last night who presents with continued pain and swelling of the right testicle now developing low-grade fevers and chills at home with additional concern for nausea and vomiting and inability to tolerate oral intake.  He endorses hematuria.  He endorses continued sharp pain in his right testicle.  Recommendations for discharge last night  were doxycycline twice daily for 2 weeks.  Given the patient's vomiting, he has not been able to tolerate his oral antibiotic.  On arrival, the patient was afebrile, not tachycardic or tachypneic, normotensive, saturating 95% on room air.  Over the course of his time in the emergency department his heart rate crept up to the low 100s but he was administered IV fluids with subsequent improvement.  Laboratory evaluation significant for CBC with leukocytosis to 34.2, hemoglobin anemic 12.6, urinalysis with proteinuria, large hemoglobin/hematuria, small leukocytes, 21-50 WBCs, few bacteria present.  Cultures collected and pending, urine culture collected and pending.  BMP concerning for an AKI with a creatinine of 1.28 compared to prior measurements 1 year ago.  CT abdomen pelvis was performed to rule out other acute intra-abdominal abnormality in setting of sepsis which was negative, revealed right-sided orchitis.  Concern for sepsis from right-sided orchitis, patient has failed outpatient management at this time with multiple episodes of nausea and vomiting, unable to keep down his home antibiotics.  He was administered IV Cipro, IV fluids and IV Zofran.  Hospitalist medicine consulted for admission.  Urology repaged for consultation, spoke with Dr. Alinda Money. Dr. Flossie Buffy accepted the patient in admission.   Final Clinical Impression(s) / ED Diagnoses Final diagnoses:  Pain in right testicle  Orchitis and epididymitis  Sepsis, due to unspecified organism, unspecified whether acute organ dysfunction present ([place])  Sepsis with acute renal failure, due to unspecified organism, unspecified acute renal failure type, unspecified whether septic shock present ([place])  AKI (acute kidney injury) ([place])  Nausea and vomiting, unspecified vomiting type    Rx / DC Orders ED Discharge Orders     None         Regan Lemming, MD 07/20/22 2254

## 2022-07-20 NOTE — H&P (Signed)
History and Physical    Patient: Hector Kelley MKL:[id number] DOB: Jan 15, 1946 DOA: 07/20/2022 DOS: the patient was seen and examined on 07/20/2022 PCP: Curlene Labrum, MD  Patient coming from: Home  Chief Complaint:  Chief Complaint  Patient presents with   Testicle Pain   HPI: Hector Kelley is a 76 y.o. male with medical history significant of BPH s/p simple prostectomy, hypothyroidism, diverticulosis who presents with persistent right testicular pain and nausea/vomiting.   Pt was initially seen at [facility] ED on 07/14/22 with right testicular pain. Had ultrasound negative for torsion  UA was negative. He was discharged home on Naproxen and urology follow up.  Then presented to [facility] last night with persistent pain, WBC of 25K. UA positive for 50 WBC and leuko esterase. Ultrasound was positive for orchitis epididymitis with pyocele. He was given IV Rocephin and Levaquin. Case was discussed with urology and admission with IV antibiotics was recommended but pt wanted to trial oral antibiotics.  However started to have nausea and vomiting, has been unable to keep down antibiotics. Has continual right testicular pain. No trouble with urination.  In the ED, he was afebrile, soft BP of 90/58 with WBC of 34.2K.  Na of 134, K of 3.8, creatinine of 1.29 with glucose of 118.  UA with large Hgb, small leukocytes, few bacteria.   He was started on IV Cipro and urology Dr. Alinda Money was consulted and did not have further recommendations for overnight. Hospitalist then consulted for admission.  Review of Systems: As mentioned in the history of present illness. All other systems reviewed and are negative. Past Medical History:  Diagnosis Date   Arthritis    BPH with obstruction/lower urinary tract symptoms    Complication of anesthesia    urine retention, requiring catheter after anesthesia   Depression    Diverticulosis    H/O clavicle fracture    Right   Hypothyroidism    Wears contact  lenses    Past Surgical History:  Procedure Laterality Date   BACK SURGERY  1980   Lumbar Laminectomy    CHOLECYSTECTOMY     CIRCUMCISION     COLONOSCOPY     KNEE ARTHROSCOPY Left    2016   KNEE ARTHROSCOPY AND ARTHROTOMY Right 1980   XI ROBOTIC ASSISTED SIMPLE PROSTATECTOMY N/A 02/14/2019   Procedure: XI ROBOTIC ASSISTED SIMPLE PROSTATECTOMY;  Surgeon: Cleon Gustin, MD;  Location: [facility] ORS;  Service: Urology;  Laterality: N/A;  3 HRS   Social History:  reports that he has never smoked. He has never used smokeless tobacco. He reports that he does not drink alcohol and does not use drugs.  No Known Allergies  History reviewed. No pertinent family history.  Prior to Admission medications   Medication Sig Start Date End Date Taking? Authorizing Provider  aspirin EC 81 MG tablet Take 81 mg by mouth daily. Swallow whole.    [provider]  Carboxymethylcellulose Sodium (ARTIFICIAL TEARS OP) Place 1 drop into both eyes daily as needed (dry eyes).    [provider]  cholecalciferol (VITAMIN D3) 25 MCG (1000 UNIT) tablet Take 1,000 Units by mouth daily.    [provider]  doxycycline (VIBRAMYCIN) 100 MG capsule Take 1 capsule (100 mg total) by mouth 2 (two) times daily for 14 days. 07/20/22 08/03/22  Quintella Reichert, MD  glucosamine-chondroitin 500-400 MG tablet Take 1 tablet by mouth daily.    [provider]  HYDROcodone-acetaminophen (NORCO) 5-325 MG tablet Take 1-2 tablets by mouth  every 6 (six) hours as needed for moderate pain or severe pain. Patient not taking: Reported on 10/02/2020 02/14/19   Debbrah Alar, PA-C  HYDROcodone-acetaminophen (NORCO/VICODIN) 5-325 MG tablet Take 1 tablet by mouth every 6 (six) hours as needed for moderate pain or severe pain. 07/20/22   Charlesetta Shanks, MD  levothyroxine (SYNTHROID, LEVOTHROID) 125 MCG tablet Take 125 mcg by mouth daily before breakfast.    [provider]  Multiple Vitamin (MULTIVITAMIN  ADULT PO) Take 1 tablet by mouth daily.    [provider]  naproxen (NAPROSYN) 375 MG tablet Take 1 tablet (375 mg total) by mouth 2 (two) times daily. 07/14/22   Hendricks Limes, PA-C    Physical Exam: Vitals:   07/20/22 2045 07/20/22 2100 07/20/22 2115 07/20/22 2130  BP: (!) 89/57 99/60 (!) 94/51 103/62  Pulse: 91 92 84 90  Resp: (!) 27 (!) 27 (!) 23 (!) 25  Temp:      TempSrc:      SpO2: 94% 93% 94% 95%  Weight:      Height:       Constitutional: NAD, calm, comfortable, nontoxic appearing elderly gentleman laying flat in bed Eyes:  lids and conjunctivae normal ENMT: Mucous membranes are moist.   Neck: normal, supple Respiratory: clear to auscultation bilaterally, no wheezing, no crackles. Normal respiratory effort. No accessory muscle use.  Cardiovascular: Regular rate and rhythm, no murmurs / rubs / gallops. No extremity edema.  Abdomen: Soft, non-distended, no tenderness, Bowel sounds positive.  GU: erythematous and edematous right testicle. No erythema or any discharge of penis Musculoskeletal: no clubbing / cyanosis. No joint deformity upper and lower extremities. Good ROM, no contractures. Normal muscle tone.  Skin: no rashes, lesions, ulcers.  Neurologic: CN 2-12 grossly intact. Strength 5/5 in all 4.  Psychiatric: Normal judgment and insight. Alert and oriented x 3. Normal mood. Data Reviewed:  See HPI  Assessment and Plan: * Orchitis Epididymal orchitis  -last ultrasound 07/19/2022 rule out torsion -repeat CT A/P without any significant change in right sided orchitis -Gonorrhea and Chlamydia tests negative -UA is not grossly positive. Urine culture pending -continue IV Cipro  -urology consulted but no further recommendations for overnight   Hypothyroidism Continue levothyroxine  Hypotension BP soft around SBP 90s. Continue IV fluids overnight.  AKI (acute kidney injury) ([place]) -creatinine elevated at 1.29 -continuous IV fluids overnight and  follow -avoid nephrotoxic agents      Advance Care Planning:   Code Status: Full Code   Consults: urology  Family Communication: wife at bedside  Severity of Illness: The appropriate patient status for this patient is INPATIENT. Inpatient status is judged to be reasonable and necessary in order to provide the required intensity of service to ensure the patient's safety. The patient's presenting symptoms, physical exam findings, and initial radiographic and laboratory data in the context of their chronic comorbidities is felt to place them at high risk for further clinical deterioration. Furthermore, it is not anticipated that the patient will be medically stable for discharge from the hospital within 2 midnights of admission.   * I certify that at the point of admission it is my clinical judgment that the patient will require inpatient hospital care spanning beyond 2 midnights from the point of admission due to high intensity of service, high risk for further deterioration and high frequency of surveillance required.*  Author: Orene Desanctis, DO 07/20/2022 10:08 PM  For on call review [email or web address].

## 2022-07-20 NOTE — Consult Note (Addendum)
Urology Consult   Physician requesting consult: Charlesetta Shanks, MD  Reason for consult: pyocele  History of Present Illness: Hector Kelley is a 76 y.o. male with a PMH of BPH s/p simple prostatectomy in 2020 with Dr. Alyson Ingles, diverticulosis, hypothyroidism who presented to the [facility] ED with 1 week of right testicular pain and swelling. Patient was seen in the ED on 07/14/22 with similar complaint and discharged with presumed diagnosis of epididymitis. A scrotal US at that time was unremarkable. He was discharged on NSAIDs with instructions to follow-up outpatient for worsening symptoms.  He presented this evening with continued pain and swelling of the right testicle. He notes he has been taking Naprosyn PRN for pain with little effect. The testicle itself he does not feel has become significantly more swollen, but it is more painful at rest and to touch. He denies overlying skin changes. He is voiding without difficulty. He reports subjective fever at home but in the ED this evening he is afebrile. He denies dysuria, hematuria. No penile discharge. No skin breakdown or scrotal wounds. No left testicular pain.   Patient notes significant improvement in pain since his presentation this evening with PRN pain medicine and antibiotics.  Past Medical History:  Diagnosis Date   Arthritis    BPH with obstruction/lower urinary tract symptoms    Complication of anesthesia    urine retention, requiring catheter after anesthesia   Depression    Diverticulosis    H/O clavicle fracture    Right   Hypothyroidism    Wears contact lenses     Past Surgical History:  Procedure Laterality Date   BACK SURGERY  1980   Lumbar Laminectomy    CHOLECYSTECTOMY     CIRCUMCISION     COLONOSCOPY     KNEE ARTHROSCOPY Left    2016   KNEE ARTHROSCOPY AND ARTHROTOMY Right 1980   XI ROBOTIC ASSISTED SIMPLE PROSTATECTOMY N/A 02/14/2019   Procedure: XI ROBOTIC ASSISTED SIMPLE PROSTATECTOMY;  Surgeon: Cleon Gustin, MD;  Location: [facility];  Service: Urology;  Laterality: N/A;  3 HRS    Medications:  Home meds:  No current facility-administered medications on file prior to encounter.   Current Outpatient Medications on File Prior to Encounter  Medication Sig Dispense Refill   aspirin EC 81 MG tablet Take 81 mg by mouth daily. Swallow whole.     Carboxymethylcellulose Sodium (ARTIFICIAL TEARS OP) Place 1 drop into both eyes daily as needed (dry eyes).     cholecalciferol (VITAMIN D3) 25 MCG (1000 UNIT) tablet Take 1,000 Units by mouth daily.     glucosamine-chondroitin 500-400 MG tablet Take 1 tablet by mouth daily.     levothyroxine (SYNTHROID, LEVOTHROID) 125 MCG tablet Take 125 mcg by mouth daily before breakfast.     Multiple Vitamin (MULTIVITAMIN ADULT PO) Take 1 tablet by mouth daily.     naproxen (NAPROSYN) 375 MG tablet Take 1 tablet (375 mg total) by mouth 2 (two) times daily. 20 tablet 0   HYDROcodone-acetaminophen (NORCO) 5-325 MG tablet Take 1-2 tablets by mouth every 6 (six) hours as needed for moderate pain or severe pain. (Patient not taking: Reported on 10/02/2020) 20 tablet 0     Scheduled Meds: Continuous Infusions: PRN Meds:.  Allergies: No Known Allergies  History reviewed. No pertinent family history.  Social History:  reports that he has never smoked. He has never used smokeless tobacco. He reports that he does not drink alcohol and does not use drugs.  ROS: A  complete review of systems was performed.  All systems are negative except for pertinent findings as noted.  Physical Exam:  Vital signs in last 24 hours: Temp:  [98 F (36.7 C)] 98 F (36.7 C) (11/01 0030) Pulse Rate:  [87-100] 100 (11/01 0030) Resp:  [18] 18 (11/01 0030) BP: (103-117)/(51-83) 107/51 (11/01 0030) SpO2:  [95 %-99 %] 95 % (11/01 0030) Weight:  [93 kg] 93 kg (10/31 2011) Constitutional:  Alert and oriented, No acute distress Cardiovascular: Regular rate and rhythm, No JVD Respiratory: Normal  respiratory effort, Lungs clear bilaterally GI: Abdomen is soft, nontender, nondistended, no abdominal masses Genitourinary: Normal male phallus, testes are descended bilaterally. Normal left hemiscrotum. Right hemiscrotal swelling with moderate right hydrocele, mildly indurated but no palpable pockets of fluctuance. Tender to palpation of the epididymal tail, no tenderness to palpation of the right spermatic cord. No palpable crepitus on palpation of the scrotum or tracking superiorly through the inguinal canal. No overlying erythema or skin breakdown, no drainage. No perineal tenderness or induration. Lymphatic: No lymphadenopathy Neurologic: Grossly intact, no focal deficits Psychiatric: Normal mood and affect  Laboratory Data:  Recent Labs    07/19/22 2246  WBC 25.9*  HGB 13.5  HCT 40.8  PLT 334    Recent Labs    07/19/22 2246  NA 136  K 3.1*  CL 104  GLUCOSE 118*  BUN 18  CALCIUM 9.0  CREATININE 1.27*     Results for orders placed or performed during the hospital encounter of 07/19/22 (from the past 24 hour(s))  Urinalysis, Routine w reflex microscopic Urine, Clean Catch     Status: Abnormal   Collection Time: 07/19/22 10:46 PM  Result Value Ref Range   Color, Urine YELLOW YELLOW   APPearance HAZY (A) CLEAR   Specific Gravity, Urine 1.024 1.005 - 1.030   pH 7.0 5.0 - 8.0   Glucose, UA NEGATIVE NEGATIVE mg/dL   Hgb urine dipstick NEGATIVE NEGATIVE   Bilirubin Urine NEGATIVE NEGATIVE   Ketones, ur 5 (A) NEGATIVE mg/dL   Protein, ur 100 (A) NEGATIVE mg/dL   Nitrite NEGATIVE NEGATIVE   Leukocytes,Ua MODERATE (A) NEGATIVE   RBC / HPF 6-10 0 - 5 RBC/hpf   WBC, UA >50 (H) 0 - 5 WBC/hpf   Bacteria, UA RARE (A) NONE SEEN   Mucus PRESENT   Basic metabolic panel     Status: Abnormal   Collection Time: 07/19/22 10:46 PM  Result Value Ref Range   Sodium 136 135 - 145 mmol/L   Potassium 3.1 (L) 3.5 - 5.1 mmol/L   Chloride 104 98 - 111 mmol/L   CO2 23 22 - 32 mmol/L    Glucose, Bld 118 (H) 70 - 99 mg/dL   BUN 18 8 - 23 mg/dL   Creatinine, Ser 1.27 (H) 0.61 - 1.24 mg/dL   Calcium 9.0 8.9 - 10.3 mg/dL   GFR, Estimated 58 (L) >60 mL/min   Anion gap 9 5 - 15  CBC     Status: Abnormal   Collection Time: 07/19/22 10:46 PM  Result Value Ref Range   WBC 25.9 (H) 4.0 - 10.5 K/uL   RBC 4.31 4.22 - 5.81 MIL/uL   Hemoglobin 13.5 13.0 - 17.0 g/dL   HCT 40.8 39.0 - 52.0 %   MCV 94.7 80.0 - 100.0 fL   MCH 31.3 26.0 - 34.0 pg   MCHC 33.1 30.0 - 36.0 g/dL   RDW 12.9 11.5 - 15.5 %   Platelets 334 150 - 400  K/uL   nRBC 0.0 0.0 - 0.2 %   No results found for this or any previous visit (from the past 240 hour(s)).  Renal Function: Recent Labs    07/19/22 2246  CREATININE 1.27*   Estimated Creatinine Clearance: 52.6 mL/min (A) (by C-G formula based on SCr of 1.27 mg/dL (H)).  Radiologic Imaging: US SCROTUM W/DOPPLER  Result Date: 07/19/2022 CLINICAL DATA:  Right testicular pain. EXAM: SCROTAL ULTRASOUND DOPPLER ULTRASOUND OF THE TESTICLES TECHNIQUE: Complete ultrasound examination of the testicles, epididymis, and other scrotal structures was performed. Color and spectral Doppler ultrasound were also utilized to evaluate blood flow to the testicles. COMPARISON:  Ultrasound dated 07/14/2022. FINDINGS: Right testicle Measurements: 4.9 x 3.8 x 3.5 cm. The right testicle is heterogeneous and slightly hypervascular. No discrete mass. Left testicle Measurements: 4.2 x 2.8 x 3.3 cm. The left testicle is unremarkable. Right epididymis: The right epididymis is enlarged, heterogeneous, and hypervascular. Left epididymis:  Normal in size and appearance. Hydrocele:  Right-sided septated hydrocele. Varicocele:  None visualized. Pulsed Doppler interrogation of both testes demonstrates normal low resistance arterial and venous waveforms bilaterally. IMPRESSION: 1. Right-sided epididymo-orchitis. Correlation with clinical exam and urinalysis recommended. 2. Septated right pyocele.  Electronically Signed   By: Anner Crete M.D.   On: 07/19/2022 21:00    I independently reviewed the above imaging studies.  Impression/Recommendation Ashby Moskal is a 76 yo male with a PMH of BPH s/p simple prostatectomy in 2020 who presented to the ED for a second time in one week with right testicular pain and swelling with ultrasound findings c/f right pyocele. We discussed that his course has likely not improved given absence of antibiotics and discussed this was a necessary addition to anti-inflammatories to address his current condition.  While he is afebrile and hemodynamically stable without significant comorbidities, he does have a leukocytosis to 25.9 and a UA c/f infection; urine culture is pending. I discussed with patient and wife that my recommendation, as well as the safest and most conservative management in his case, would include admission to the hospitalist service for IV antibiotics, repeat blood work to ensure resolution of leukocytosis, and serial exams to monitor for worsening infection. We discussed that while surgical intervention is not indicated at this time, an infection could progress even despite antibiotic therapy.  They expressed understanding but expressed desire to discharge with oral antibiotics and return as needed for further evaluation. Patient's wife is a former Therapist, sports and expressed she will keep a close eye on him and bring him back promptly for evaluation if his condition worsens.  Would recommend discharge on 14d doxycycline, follow-up Ucx and cater antibiotics accordingly, NSAIDs (currently on Naprosyn, but can transition to ibuprofen if patient preferred), scrotal support, ice to the affected area on/off x16m Return precautions including fevers, systemic symptoms, worsening pain discussed. Will request follow-up in clinic in 2-3 weeks to assess response.  MLegrand ComoEmmerling 07/20/2022, 12:55 AM

## 2022-07-20 NOTE — ED Provider Notes (Signed)
Urology has evaluated the patient at bedside.  Patient wants to go home.  Recommendation for doxycycline twice daily for 2 weeks.  Discussed very close return precautions for new or concerning symptoms.   Quintella Reichert, MD 07/20/22 [phone]

## 2022-07-20 NOTE — Progress Notes (Signed)
Pharmacy Antibiotic Note  Brennden Masten is a 76 y.o. male admitted on 07/20/2022 with , c/o pain to right testicle. Pt endorses worsening pain, blood in urine and feeling chilled. .  Pharmacy has been consulted for cipro dosing for orchitis.  Plan: Cipro '400mg'$  IV q12h Follow renal function, cultures and clinical course  Height: 6' (182.9 cm) Weight: 93 kg (205 lb 0.4 oz) IBW/kg (Calculated) : 77.6  Temp (24hrs), Avg:98.3 F (36.8 C), Min:98 F (36.7 C), Max:98.9 F (37.2 C)  Recent Labs  Lab 07/19/22 2246 07/20/22 1800 07/20/22 2048  WBC 25.9* 34.2*  --   CREATININE 1.27* 1.28*  --   LATICACIDVEN  --   --  1.6    Estimated Creatinine Clearance: 52.2 mL/min (A) (by C-G formula based on SCr of 1.28 mg/dL (H)).    No Known Allergies   Thank you for allowing pharmacy to be a part of this patient's care.  Dolly Rias RPh 07/20/2022, 10:10 PM

## 2022-07-21 ENCOUNTER — Inpatient Hospital Stay (HOSPITAL_COMMUNITY): Payer: Medicare HMO

## 2022-07-21 DIAGNOSIS — N452 Orchitis: Secondary | ICD-10-CM | POA: Diagnosis not present

## 2022-07-21 DIAGNOSIS — R112 Nausea with vomiting, unspecified: Secondary | ICD-10-CM | POA: Diagnosis not present

## 2022-07-21 DIAGNOSIS — N179 Acute kidney failure, unspecified: Secondary | ICD-10-CM | POA: Diagnosis not present

## 2022-07-21 LAB — CBC
HCT: 36.4 % — ABNORMAL LOW (ref 39.0–52.0)
Hemoglobin: 12 g/dL — ABNORMAL LOW (ref 13.0–17.0)
MCH: 31.2 pg (ref 26.0–34.0)
MCHC: 33 g/dL (ref 30.0–36.0)
MCV: 94.5 fL (ref 80.0–100.0)
Platelets: 283 10*3/uL (ref 150–400)
RBC: 3.85 MIL/uL — ABNORMAL LOW (ref 4.22–5.81)
RDW: 13.4 % (ref 11.5–15.5)
WBC: 35.2 10*3/uL — ABNORMAL HIGH (ref 4.0–10.5)
nRBC: 0 % (ref 0.0–0.2)

## 2022-07-21 LAB — URINE CULTURE: Culture: NO GROWTH

## 2022-07-21 LAB — BASIC METABOLIC PANEL
Anion gap: 10 (ref 5–15)
BUN: 21 mg/dL (ref 8–23)
CO2: 22 mmol/L (ref 22–32)
Calcium: 8.7 mg/dL — ABNORMAL LOW (ref 8.9–10.3)
Chloride: 103 mmol/L (ref 98–111)
Creatinine, Ser: 1.16 mg/dL (ref 0.61–1.24)
GFR, Estimated: >60 mL/min (ref >60)
Glucose, Bld: 104 mg/dL — ABNORMAL HIGH (ref 70–99)
Potassium: 3.8 mmol/L (ref 3.5–5.1)
Sodium: 135 mmol/L (ref 135–145)

## 2022-07-21 NOTE — Progress Notes (Signed)
  Progress Note   Patient: Hector Kelley EZM:[id number] DOB: 22-Oct-1945 DOA: 07/20/2022     1 DOS: the patient was seen and examined on 07/21/2022   Brief hospital course: 76 y.o. male with medical history significant of BPH s/p simple prostectomy, hypothyroidism, diverticulosis who presents with persistent right testicular pain and nausea/vomiting.    Pt was initially seen at [facility] ED on 07/14/22 with right testicular pain. Had ultrasound negative for torsion  UA was negative. He was discharged home on Naproxen and urology follow up.  Then presented to [facility] last night with persistent pain, WBC of 25K. UA positive for 50 WBC and leuko esterase. Ultrasound was positive for orchitis epididymitis with pyocele. He was given IV Rocephin and Levaquin. Case was discussed with urology and admission with IV antibiotics was recommended but pt wanted to trial oral antibiotics.  However started to have nausea and vomiting, has been unable to keep down antibiotics. Has continual right testicular pain. No trouble with urination.  Assessment and Plan: * Orchitis Epididymal orchitis  -most recent ultrasound 07/19/2022 rule out torsion -repeat CT A/P without any significant change in right sided orchitis -Gonorrhea and Chlamydia tests negative -UA is not grossly positive. Recent urine cx from 10/31 growing pseudomonas -Pt had been continued on IV Cipro with pt reporting clinical improvement this AM, will continue -recheck CBC in AM    Hypothyroidism Continue levothyroxine   Hypotension Improved with IVF hydration   AKI (acute kidney injury) ([place]) -creatinine elevated at 1.29 -Cr improved with IVF  COVID positive test at time of admit from recent prior covid infection -Pt reports recent covid infection one month prior.  -Currently without resp symptoms. CXR reviewed, clear -Suspect expected residual positive covid test following recent covid infection      Subjective: Reports less pain in  scrotal area this AM  Physical Exam: Vitals:   07/21/22 1251 07/21/22 1400 07/21/22 1609 07/21/22 1724  BP:  120/71 108/60 114/65  Pulse:  84 69 77  Resp:  (!) 22 (!) 32 18  Temp: 98.8 F (37.1 C)  98.4 F (36.9 C) 98.7 F (37.1 C)  TempSrc: Oral  Oral Oral  SpO2:  92% 93% 97%  Weight:      Height:       General exam: Awake, laying in bed, in nad Respiratory system: Normal respiratory effort, no wheezing Cardiovascular system: regular rate, s1, s2 Gastrointestinal system: Soft, nondistended, positive BS Central nervous system: CN2-12 grossly intact, strength intact Extremities: Perfused, no clubbing Skin: Normal skin turgor, scrotal skin swollen, erythematous Psychiatry: Mood normal // no visual hallucinations   Data Reviewed:  Labs reviewed: Na 135, K 3.8, Cr 1.16, WBC 35.2, Hgb 12  Family Communication: Pt in room, family at bedside  Disposition: Status is: Inpatient Remains inpatient appropriate because: Severity of illness  Planned Discharge Destination: Home    Author: Marylu Lund, MD 07/21/2022 6:02 PM  For on call review [email or web address].

## 2022-07-21 NOTE — Sepsis Progress Note (Signed)
Following for sepsis monitoring ?

## 2022-07-21 NOTE — Hospital Course (Signed)
76 y.o. male with medical history significant of BPH s/p simple prostectomy, hypothyroidism, diverticulosis who presents with persistent right testicular pain and nausea/vomiting.    Pt was initially seen at [facility] ED on 07/14/22 with right testicular pain. Had ultrasound negative for torsion  UA was negative. He was discharged home on Naproxen and urology follow up.  Then presented to [facility] last night with persistent pain, WBC of 25K. UA positive for 50 WBC and leuko esterase. Ultrasound was positive for orchitis epididymitis with pyocele. He was given IV Rocephin and Levaquin. Case was discussed with urology and admission with IV antibiotics was recommended but pt wanted to trial oral antibiotics.  However started to have nausea and vomiting, has been unable to keep down antibiotics. Has continual right testicular pain. No trouble with urination.

## 2022-07-22 ENCOUNTER — Ambulatory Visit: Payer: Medicare HMO | Admitting: Urology

## 2022-07-22 DIAGNOSIS — R112 Nausea with vomiting, unspecified: Secondary | ICD-10-CM | POA: Diagnosis not present

## 2022-07-22 DIAGNOSIS — N179 Acute kidney failure, unspecified: Secondary | ICD-10-CM | POA: Diagnosis not present

## 2022-07-22 DIAGNOSIS — N452 Orchitis: Secondary | ICD-10-CM | POA: Diagnosis not present

## 2022-07-22 LAB — COMPREHENSIVE METABOLIC PANEL
ALT: 30 U/L (ref 0–44)
AST: 28 U/L (ref 15–41)
Albumin: 2.6 g/dL — ABNORMAL LOW (ref 3.5–5.0)
Alkaline Phosphatase: 74 U/L (ref 38–126)
Anion gap: 9 (ref 5–15)
BUN: 16 mg/dL (ref 8–23)
CO2: 21 mmol/L — ABNORMAL LOW (ref 22–32)
Calcium: 8.4 mg/dL — ABNORMAL LOW (ref 8.9–10.3)
Chloride: 104 mmol/L (ref 98–111)
Creatinine, Ser: 1.08 mg/dL (ref 0.61–1.24)
GFR, Estimated: >60 mL/min (ref >60)
Glucose, Bld: 102 mg/dL — ABNORMAL HIGH (ref 70–99)
Potassium: 3.8 mmol/L (ref 3.5–5.1)
Sodium: 134 mmol/L — ABNORMAL LOW (ref 135–145)
Total Bilirubin: 0.6 mg/dL (ref 0.3–1.2)
Total Protein: 5.7 g/dL — ABNORMAL LOW (ref 6.5–8.1)

## 2022-07-22 LAB — URINE CULTURE: Culture: >=100000 — AB

## 2022-07-22 LAB — CBC
HCT: 35.3 % — ABNORMAL LOW (ref 39.0–52.0)
Hemoglobin: 11.6 g/dL — ABNORMAL LOW (ref 13.0–17.0)
MCH: 30.9 pg (ref 26.0–34.0)
MCHC: 32.9 g/dL (ref 30.0–36.0)
MCV: 93.9 fL (ref 80.0–100.0)
Platelets: 286 10*3/uL (ref 150–400)
RBC: 3.76 MIL/uL — ABNORMAL LOW (ref 4.22–5.81)
RDW: 13.3 % (ref 11.5–15.5)
WBC: 25.1 10*3/uL — ABNORMAL HIGH (ref 4.0–10.5)
nRBC: 0 % (ref 0.0–0.2)

## 2022-07-22 MED ORDER — IBUPROFEN 200 MG PO TABS: 400.0000 mg | ORAL_TABLET | ORAL | Status: DC | PRN

## 2022-07-22 NOTE — Progress Notes (Signed)
  Transition of Care [facility]) Screening Note   Patient Details  Name: Hector Kelley Date of Birth: 01-16-1946   Transition of Care [facility]) CM/SW Contact:    Vassie Moselle, LCSW Phone Number: 07/22/2022, 11:15 AM    Transition of Care Department [facility]) has reviewed patient and no TOC needs have been identified at this time. We will continue to monitor patient advancement through interdisciplinary progression rounds. If new patient transition needs arise, please place a TOC consult.

## 2022-07-22 NOTE — Progress Notes (Addendum)
  Progress Note   Patient: Hector Kelley [id number] DOB: 01-17-1946 DOA: 07/20/2022     2 DOS: the patient was seen and examined on 07/22/2022   Brief hospital course: 76 y.o. male with medical history significant of BPH s/p simple prostectomy, hypothyroidism, diverticulosis who presents with persistent right testicular pain and nausea/vomiting.    Pt was initially seen at [facility] ED on 07/14/22 with right testicular pain. Had ultrasound negative for torsion  UA was negative. He was discharged home on Naproxen and urology follow up.  Then presented to [facility] last night with persistent pain, WBC of 25K. UA positive for 50 WBC and leuko esterase. Ultrasound was positive for orchitis epididymitis with pyocele. He was given IV Rocephin and Levaquin. Case was discussed with urology and admission with IV antibiotics was recommended but pt wanted to trial oral antibiotics.  However started to have nausea and vomiting, has been unable to keep down antibiotics. Has continual right testicular pain. No trouble with urination.  Assessment and Plan: * Orchitis Epididymal orchitis  -most recent ultrasound 07/19/2022 rule out torsion -repeat CT A/P without any significant change in right sided orchitis -Gonorrhea and Chlamydia tests negative -UA is not grossly positive. Recent urine cx from 10/31 growing pan-sensitive pseudomonas -Pt had been continued on IV Cipro with continued clinical improvement this AM, will continue -wbc down to 25.1k -discussed with Urology who recommends ice to area, scrotal support, PRN NSAID for analgesia   Hypothyroidism Continue levothyroxine   Hypotension Improved with IVF hydration   AKI (acute kidney injury) ([place]) -creatinine elevated at 1.29 -Cr now improved with IVF  COVID positive test at time of admit from recent prior covid infection -Pt reports recent covid infection one month prior.  -Currently without resp symptoms. CXR reviewed, clear -Suspect expected  residual positive covid test following recent covid infection      Subjective: States pain is now improved  Physical Exam: Vitals:   07/21/22 2122 07/22/22 0126 07/22/22 0535 07/22/22 1217  BP: (!) 116/54 124/71 129/79 101/74  Pulse: 75 70 70 80  Resp: '20 16 19 18  '$ Temp: 98.9 F (37.2 C) 97.7 F (36.5 C) 97.8 F (36.6 C) 98.2 F (36.8 C)  TempSrc: Oral Oral Oral Oral  SpO2: 95% 96% 97% 96%  Weight:      Height:       General exam: Conversant, in no acute distress Respiratory system: normal chest rise, clear, no audible wheezing Cardiovascular system: regular rhythm, s1-s2 Gastrointestinal system: Nondistended, nontender, pos BS Central nervous system: No seizures, no tremors Extremities: No cyanosis, no joint deformities Skin: No rashes, erythema and edema involving scrotum and penile shaft. Erythema seems improved today Psychiatry: Affect normal // no auditory hallucinations    Data Reviewed:  Labs reviewed: Na 134, K 3.8, Cr 1.08, WBC 25.1, Hgb 11.6  Family Communication: Pt in room, family at bedside  Disposition: Status is: Inpatient Remains inpatient appropriate because: Severity of illness  Planned Discharge Destination: Home    Author: Marylu Lund, MD 07/22/2022 5:17 PM  For on call review [email or web address].

## 2022-07-23 ENCOUNTER — Telehealth (HOSPITAL_BASED_OUTPATIENT_CLINIC_OR_DEPARTMENT_OTHER): Payer: Self-pay | Admitting: *Deleted

## 2022-07-23 DIAGNOSIS — N453 Epididymo-orchitis: Secondary | ICD-10-CM

## 2022-07-23 DIAGNOSIS — N179 Acute kidney failure, unspecified: Secondary | ICD-10-CM | POA: Diagnosis not present

## 2022-07-23 DIAGNOSIS — N452 Orchitis: Secondary | ICD-10-CM | POA: Diagnosis not present

## 2022-07-23 LAB — CBC
HCT: 37.4 % — ABNORMAL LOW (ref 39.0–52.0)
Hemoglobin: 12.2 g/dL — ABNORMAL LOW (ref 13.0–17.0)
MCH: 30.8 pg (ref 26.0–34.0)
MCHC: 32.6 g/dL (ref 30.0–36.0)
MCV: 94.4 fL (ref 80.0–100.0)
Platelets: 319 10*3/uL (ref 150–400)
RBC: 3.96 MIL/uL — ABNORMAL LOW (ref 4.22–5.81)
RDW: 13.2 % (ref 11.5–15.5)
WBC: 11.6 10*3/uL — ABNORMAL HIGH (ref 4.0–10.5)
nRBC: 0 % (ref 0.0–0.2)

## 2022-07-23 LAB — COMPREHENSIVE METABOLIC PANEL
ALT: 31 U/L (ref 0–44)
AST: 25 U/L (ref 15–41)
Albumin: 2.6 g/dL — ABNORMAL LOW (ref 3.5–5.0)
Alkaline Phosphatase: 81 U/L (ref 38–126)
Anion gap: 6 (ref 5–15)
BUN: 15 mg/dL (ref 8–23)
CO2: 23 mmol/L (ref 22–32)
Calcium: 8.3 mg/dL — ABNORMAL LOW (ref 8.9–10.3)
Chloride: 105 mmol/L (ref 98–111)
Creatinine, Ser: 0.92 mg/dL (ref 0.61–1.24)
GFR, Estimated: >60 mL/min (ref >60)
Glucose, Bld: 89 mg/dL (ref 70–99)
Potassium: 3.7 mmol/L (ref 3.5–5.1)
Sodium: 134 mmol/L — ABNORMAL LOW (ref 135–145)
Total Bilirubin: 0.4 mg/dL (ref 0.3–1.2)
Total Protein: 6 g/dL — ABNORMAL LOW (ref 6.5–8.1)

## 2022-07-23 MED ORDER — CIPROFLOXACIN HCL 500 MG PO TABS: 500.0000 mg | ORAL_TABLET | Freq: Two times a day (BID) | ORAL | 0 refills | Status: AC

## 2022-07-23 NOTE — Discharge Instructions (Signed)
Please take over the counter probiotic

## 2022-07-23 NOTE — Discharge Summary (Signed)
Physician Discharge Summary   Patient: Hector Kelley MRN: [id number] DOB: 1945/11/30  Admit date:     07/20/2022  Discharge date: 07/23/22  Discharge Physician: Marylu Lund   PCP: Curlene Labrum, MD   Recommendations at discharge:    Follow up with PCP in 1-2 weeks Follow up with Urology as scheduled  Discharge Diagnoses: Principal Problem:   Orchitis Active Problems:   AKI (acute kidney injury) ([place])   Hypotension   Hypothyroidism  Resolved Problems:   * No resolved hospital problems. *  Hospital Course: 76 y.o. male with medical history significant of BPH s/p simple prostectomy, hypothyroidism, diverticulosis who presents with persistent right testicular pain and nausea/vomiting.    Pt was initially seen at [facility] ED on 07/14/22 with right testicular pain. Had ultrasound negative for torsion  UA was negative. He was discharged home on Naproxen and urology follow up.  Then presented to [facility] last night with persistent pain, WBC of 25K. UA positive for 50 WBC and leuko esterase. Ultrasound was positive for orchitis epididymitis with pyocele. He was given IV Rocephin and Levaquin. Case was discussed with urology and admission with IV antibiotics was recommended but pt wanted to trial oral antibiotics.  However started to have nausea and vomiting, has been unable to keep down antibiotics. Has continual right testicular pain. No trouble with urination.  Assessment and Plan: * Orchitis Epididymal orchitis  -most recent ultrasound 07/19/2022 rule out torsion -repeat CT A/P without any significant change in right sided orchitis -Gonorrhea and Chlamydia tests negative -UA is not grossly positive. Recent urine cx from 10/31 growing pan-sensitive pseudomonas -Pt had been continued on IV Cipro with continued clinical improvement. Discharge on PO cipro to complete course -wbc down to 11k -discussed with Urology who recommends ice to area, scrotal support, PRN NSAID for analgesia    Hypothyroidism Continue levothyroxine   Hypotension Improved with IVF hydration   AKI (acute kidney injury) ([place]) -creatinine elevated at 1.29 -Cr now improved with IVF   COVID positive test at time of admit from recent prior covid infection without active infection -Pt reports recent covid infection one month prior.  -Currently without resp symptoms. CXR reviewed, clear -Suspect expected residual positive covid test following recent covid infection       Consultants: Discussed case with Urology over the phone Procedures performed:   Disposition: Home Diet recommendation:  Regular diet DISCHARGE MEDICATION: Allergies as of 07/23/2022   No Known Allergies      Medication List     STOP taking these medications    doxycycline 100 MG capsule Commonly known as: VIBRAMYCIN   levofloxacin 500 MG tablet Commonly known as: LEVAQUIN   naproxen 375 MG tablet Commonly known as: NAPROSYN       TAKE these medications    ARTIFICIAL TEARS OP Place 1 drop into both eyes daily as needed (dry eyes).   aspirin EC 81 MG tablet Take 81 mg by mouth daily. Swallow whole.   cholecalciferol 25 MCG (1000 UNIT) tablet Commonly known as: VITAMIN D3 Take 1,000 Units by mouth daily.   ciprofloxacin 500 MG tablet Commonly known as: Cipro Take 1 tablet (500 mg total) by mouth 2 (two) times daily for 12 days.   glucosamine-chondroitin 500-400 MG tablet Take 1 tablet by mouth daily.   HYDROcodone-acetaminophen 5-325 MG tablet Commonly known as: NORCO/VICODIN Take 1 tablet by mouth every 6 (six) hours as needed for moderate pain or severe pain. What changed: Another medication with the same name was  removed. Continue taking this medication, and follow the directions you see here.   levothyroxine 125 MCG tablet Commonly known as: SYNTHROID Take 125 mcg by mouth daily before breakfast.   MULTIVITAMIN ADULT PO Take 1 tablet by mouth daily.        Follow-up Information      Burdine, Virgina Evener, MD Follow up in 1 week(s).   Specialty: Family Medicine Why: Hospital follow up Contact information: [place] [address] [phone]         Cleon Gustin, MD Follow up.   Specialty: Urology Why: as scheduled Contact information: [place] [address] [place] [address] [phone]                Discharge Exam: Danley Danker Weights   07/20/22 1827  Weight: 93 kg   General exam: Awake, laying in bed, in nad Respiratory system: Normal respiratory effort, no wheezing Cardiovascular system: regular rate, s1, s2 Gastrointestinal system: Soft, nondistended, positive BS Central nervous system: CN2-12 grossly intact, strength intact Extremities: Perfused, no clubbing Skin: Normal skin turgor, no notable skin lesions seen Psychiatry: Mood normal // no visual hallucinations    Condition at discharge: fair  The results of significant diagnostics from this hospitalization (including imaging, microbiology, ancillary and laboratory) are listed below for reference.   Imaging Studies: DG CHEST PORT 1 VIEW  Result Date: 07/21/2022 CLINICAL DATA:  COVID-19. EXAM: PORTABLE CHEST 1 VIEW COMPARISON:  None Available. FINDINGS: Mild cardiomegaly is noted. Both lungs are clear. The visualized skeletal structures are unremarkable. IMPRESSION: No active disease. Electronically Signed   By: Marijo Conception M.D.   On: 07/21/2022 08:53   CT ABDOMEN PELVIS W CONTRAST  Result Date: 07/20/2022 CLINICAL DATA:  Hernia. EXAM: CT ABDOMEN AND PELVIS WITH CONTRAST TECHNIQUE: Multidetector CT imaging of the abdomen and pelvis was performed using the standard protocol following bolus administration of intravenous contrast. RADIATION DOSE REDUCTION: This exam was performed according to the departmental dose-optimization program which includes automated exposure control, adjustment of the mA and/or kV according to patient size and/or use of iterative reconstruction  technique. CONTRAST:  12m OMNIPAQUE IOHEXOL 300 MG/ML  SOLN COMPARISON:  None Available. FINDINGS: Lower chest: Bibasilar streaky atelectasis/scarring. No intra-abdominal free air or free fluid. Hepatobiliary: Fatty liver. The biliary dilatation. Cholecystectomy. No retained calcified stone noted in the central CBD. Pancreas: Unremarkable. No pancreatic ductal dilatation or surrounding inflammatory changes. Spleen: Normal in size without focal abnormality. Adrenals/Urinary Tract: The adrenal glands unremarkable there is a 3 mm nonobstructing left renal inferior pole calculus. Subcentimeter left renal upper pole hypodense focus is too small to characterize. There is no hydronephrosis on either side. There is symmetric enhancement and excretion of contrast by both kidneys. The visualized ureters and urinary bladder appear unremarkable. Stomach/Bowel: There is a 3.5 cm diverticulum from the gastric fundus. There is sigmoid diverticulosis without active inflammatory changes. There is no bowel obstruction or active inflammation. The appendix is normal. Vascular/Lymphatic: Moderate aortoiliac atherosclerotic disease. The IVC is unremarkable. No pelvic mass. There is no adenopathy. Reproductive: The prostate and seminal vesicles are grossly remarkable. Apparent post procedural changes of TURP. Other: There is inflammatory changes of the visualized right scrotal wall and mild inflammatory changes of the spermatic cord in the right inguinal canal. Findings in keeping with right-sided epididymal orchitis seen on the ultrasound of 07/19/2022. Musculoskeletal: Degenerative changes of the spine. No acute osseous pathology. IMPRESSION: 1. Findings of right-sided orchitis. 2. Sigmoid diverticulosis. No bowel obstruction.  Normal appendix. 3. A 3 mm nonobstructing left renal inferior pole calculus. No hydronephrosis. 4. Fatty liver. 5.  Aortic Atherosclerosis (ICD10-I70.0). Electronically Signed   By: Anner Crete M.D.   On:  07/20/2022 20:00   US SCROTUM W/DOPPLER  Result Date: 07/19/2022 CLINICAL DATA:  Right testicular pain. EXAM: SCROTAL ULTRASOUND DOPPLER ULTRASOUND OF THE TESTICLES TECHNIQUE: Complete ultrasound examination of the testicles, epididymis, and other scrotal structures was performed. Color and spectral Doppler ultrasound were also utilized to evaluate blood flow to the testicles. COMPARISON:  Ultrasound dated 07/14/2022. FINDINGS: Right testicle Measurements: 4.9 x 3.8 x 3.5 cm. The right testicle is heterogeneous and slightly hypervascular. No discrete mass. Left testicle Measurements: 4.2 x 2.8 x 3.3 cm. The left testicle is unremarkable. Right epididymis: The right epididymis is enlarged, heterogeneous, and hypervascular. Left epididymis:  Normal in size and appearance. Hydrocele:  Right-sided septated hydrocele. Varicocele:  None visualized. Pulsed Doppler interrogation of both testes demonstrates normal low resistance arterial and venous waveforms bilaterally. IMPRESSION: 1. Right-sided epididymo-orchitis. Correlation with clinical exam and urinalysis recommended. 2. Septated right pyocele. Electronically Signed   By: Anner Crete M.D.   On: 07/19/2022 21:00   US SCROTUM W/DOPPLER  Result Date: 07/14/2022 CLINICAL DATA:  Testicle pain EXAM: SCROTAL ULTRASOUND DOPPLER ULTRASOUND OF THE TESTICLES TECHNIQUE: Complete ultrasound examination of the testicles, epididymis, and other scrotal structures was performed. Color and spectral Doppler ultrasound were also utilized to evaluate blood flow to the testicles. COMPARISON:  None Available. FINDINGS: Right testicle Measurements: 4.7 x 3 x 3.4 cm. No mass or microlithiasis visualized. Left testicle Measurements: 4.8 x 2.6 x 3 cm. No mass or microlithiasis visualized. Right epididymis: Slightly heterogeneous and enlarged compared to the left. Left epididymis:  Normal in size and appearance. Hydrocele:  Small bilateral hydroceles. Varicocele:  None visualized.  Pulsed Doppler interrogation of both testes demonstrates normal low resistance arterial and venous waveforms bilaterally. IMPRESSION: 1. Negative for acute testicular torsion 2. Small bilateral hydroceles 3. Slight asymmetric heterogeneity and enlargement of right epididymis compared to left which could be due to mild edematous change. No significant hyperemia. Electronically Signed   By: Donavan Foil M.D.   On: 07/14/2022 18:13    Microbiology: Results for orders placed or performed during the hospital encounter of 07/20/22  Urine Culture     Status: None   Collection Time: 07/20/22  6:00 PM   Specimen: Urine, Clean Catch  Result Value Ref Range Status   Specimen Description   Final    URINE, CLEAN CATCH Performed at [facility], [place] [address] [place], [place] [address]    Special Requests   Final    NONE Performed at [facility], [place] [address], [place], [place] [address]    Culture   Final    NO GROWTH Performed at [facility], [place] [address] [place], [place] [address]    Report Status 07/21/2022 FINAL  Final  Blood culture (routine x 2)     Status: None (Preliminary result)   Collection Time: 07/20/22  6:10 PM   Specimen: BLOOD  Result Value Ref Range Status   Specimen Description   Final    BLOOD RIGHT ANTECUBITAL Performed at [place] [address], [place],  [address]    Special Requests   Final    BOTTLES DRAWN AEROBIC AND ANAEROBIC Blood Culture results may not be optimal due to an inadequate volume of blood received in culture bottles Performed at [facility]  [place] [address], [place], [place] [address]    Culture   Final    NO GROWTH 3 DAYS Performed at [facility], [place] [address] [place], [place] [address]    Report Status PENDING  Incomplete  Blood culture (routine x 2)     Status: None (Preliminary result)   Collection Time: 07/20/22  6:19 PM   Specimen:  BLOOD  Result Value Ref Range Status   Specimen Description   Final    BLOOD LEFT ANTECUBITAL Performed at [place] [address] [place], [place] [address]    Special Requests   Final    BOTTLES DRAWN AEROBIC AND ANAEROBIC Blood Culture results may not be optimal due to an excessive volume of blood received in culture bottles Performed at [place] [address], [place], [place] [address]    Culture   Final    NO GROWTH 3 DAYS Performed at [facility], [place] [address] [place],  [address]    Report Status PENDING  Incomplete  Resp Panel by RT-PCR (Flu A&B, Covid) Anterior Nasal Swab     Status: Abnormal   Collection Time: 07/20/22  8:48 PM   Specimen: Anterior Nasal Swab  Result Value Ref Range Status   SARS Coronavirus 2 by RT PCR POSITIVE (A) NEGATIVE Final    Comment: (NOTE) SARS-CoV-2 target nucleic acids are DETECTED.  The SARS-CoV-2 RNA is generally detectable in upper respiratory specimens during the acute phase of infection. Positive results are indicative of the presence of the identified virus, but do not rule out bacterial infection or co-infection with other pathogens not detected by the test. Clinical correlation with patient history and other diagnostic information is necessary to determine patient infection status. The expected result is Negative.  Fact Sheet for Patients: EntrepreneurPulse.com.au  Fact Sheet for Healthcare Providers: IncredibleEmployment.be  This test is not yet approved or cleared by the [place] FDA and  has been authorized for detection and/or diagnosis of SARS-CoV-2 by FDA under an Emergency Use Authorization (EUA).  This EUA will remain in effect (meaning this test can be used) for the duration of  the COVID-19 declaration under Section 564(b)(1) of the A ct, 21 U.S.C. section 360bbb-3(b)(1), unless the authorization is terminated or  revoked sooner.     Influenza A by PCR NEGATIVE NEGATIVE Final   Influenza B by PCR NEGATIVE NEGATIVE Final    Comment: (NOTE) The Xpert Xpress SARS-CoV-2/FLU/RSV plus assay is intended as an aid in the diagnosis of influenza from Nasopharyngeal swab specimens and should not be used as a sole basis for treatment. Nasal washings and aspirates are unacceptable for Xpert Xpress SARS-CoV-2/FLU/RSV testing.  Fact Sheet for Patients: EntrepreneurPulse.com.au  Fact Sheet for Healthcare Providers: IncredibleEmployment.be  This test is not yet approved or cleared by the [place] FDA and has been authorized for detection and/or diagnosis of SARS-CoV-2 by FDA under an Emergency Use Authorization (EUA). This EUA will remain in effect (meaning this test can be used) for the duration of the COVID-19 declaration under Section 564(b)(1) of the Act, 21 U.S.C. section 360bbb-3(b)(1), unless the authorization is terminated or revoked.  Performed at [facility], [place] [address], [place],  [address]     Labs: CBC: Recent Labs  Lab 07/19/22 2246 07/20/22 1800 07/21/22 0425 07/22/22 0521 07/23/22 0645  WBC 25.9* 34.2* 35.2* 25.1* 11.6*  NEUTROABS  --  26.6*  --   --   --  HGB 13.5 12.6* 12.0* 11.6* 12.2*  HCT 40.8 37.8* 36.4* 35.3* 37.4*  MCV 94.7 93.6 94.5 93.9 94.4  PLT 334 321 283 286 300   Basic Metabolic Panel: Recent Labs  Lab 07/19/22 2246 07/20/22 1800 07/21/22 0425 07/22/22 0521 07/23/22 0645  NA 136 134* 135 134* 134*  K 3.1* 3.8 3.8 3.8 3.7  CL 104 104 103 104 105  CO2 23 21* 22 21* 23  GLUCOSE 118* 118* 104* 102* 89  BUN 18 27* '21 16 15  '$ CREATININE 1.27* 1.28* 1.16 1.08 0.92  CALCIUM 9.0 8.9 8.7* 8.4* 8.3*   Liver Function Tests: Recent Labs  Lab 07/22/22 0521 07/23/22 0645  AST 28 25  ALT 30 31  ALKPHOS 74 81  BILITOT 0.6 0.4  PROT 5.7* 6.0*  ALBUMIN 2.6* 2.6*   CBG: No results for  input(s): "GLUCAP" in the last 168 hours.  Discharge time spent: less than 30 minutes.  Signed: Marylu Lund, MD [facility] 07/23/2022

## 2022-07-23 NOTE — Telephone Encounter (Signed)
Post ED Visit - Positive Culture Follow-up  Culture report reviewed by antimicrobial stewardship pharmacist: [place] Team '[]'$  Elenor Quinones, Pharm.D. '[]'$  Heide Guile, Pharm.D., BCPS AQ-ID '[]'$  Parks Neptune, Pharm.D., BCPS '[]'$  Alycia Rossetti, Pharm.D., BCPS '[]'$  [place], Pharm.D., BCPS, AAHIVP '[]'$  Legrand Como, Pharm.D., BCPS, AAHIVP '[]'$  Salome Arnt, PharmD, BCPS '[]'$  Johnnette Gourd, PharmD, BCPS '[]'$  Hughes Better, PharmD, BCPS '[]'$  Leeroy Cha, PharmD '[]'$  Laqueta Linden, PharmD, BCPS '[]'$  Albertina Parr, PharmD  [place] Team '[]'$  Leodis Sias, PharmD '[]'$  Lindell Spar, PharmD '[]'$  Royetta Asal, PharmD '[]'$  Graylin Shiver, Rph '[]'$  Rema Fendt) Glennon Mac, PharmD '[]'$  Arlyn Dunning, PharmD '[]'$  Netta Cedars, PharmD '[]'$  Dia Sitter, PharmD '[]'$  Leone Haven, PharmD '[x]'$  Gretta Arab, PharmD '[]'$  Theodis Shove, PharmD '[]'$  Peggyann Juba, PharmD '[]'$  Reuel Boom, PharmD   Positive urine culture Treated with Ciprofloxacin, organism sensitive to the same and no further patient follow-up is required at this time.  Hector Kelley 07/23/2022, 11:21 AM

## 2022-07-23 NOTE — Progress Notes (Signed)
Pharmacy Antibiotic Note  Cylan Borum is a 76 y.o. male admitted on 07/20/2022 with c/o pain to right testicle. Pt endorses worsening pain, blood in urine and feeling chilled. Pharmacy consulted for ciprofloxacin dosing for orchitis.  Plan: -Continue ciprofloxacin '400mg'$  IV q12h -Urine culture with pan-sensitive pseudomonas aeruginosa -Change to PO antibiotics as indicated per MD -Pharmacy to sign off consult  Height: 6' (182.9 cm) Weight: 93 kg (205 lb 0.4 oz) IBW/kg (Calculated) : 77.6  Temp (24hrs), Avg:98.6 F (37 C), Min:98.1 F (36.7 C), Max:99.6 F (37.6 C)  Recent Labs  Lab 07/19/22 2246 07/20/22 1800 07/20/22 2048 07/20/22 2333 07/21/22 0425 07/22/22 0521 07/23/22 0645  WBC 25.9* 34.2*  --   --  35.2* 25.1* 11.6*  CREATININE 1.27* 1.28*  --   --  1.16 1.08  --   LATICACIDVEN  --   --  1.6 1.6  --   --   --      Estimated Creatinine Clearance: 61.9 mL/min (by C-G formula based on SCr of 1.08 mg/dL).    No Known Allergies   Thank you for allowing pharmacy to be a part of this patient's care.  Tawnya Crook, PharmD, BCPS Clinical Pharmacist 07/23/2022 8:23 AM

## 2022-07-25 LAB — CULTURE, BLOOD (ROUTINE X 2)
Culture: NO GROWTH
Culture: NO GROWTH

## 2022-07-29 ENCOUNTER — Ambulatory Visit: Payer: Medicare HMO | Admitting: Urology

## 2022-07-29 ENCOUNTER — Encounter: Payer: Self-pay | Admitting: Urology

## 2022-07-29 VITALS — BP 107/70 | HR 61 | Ht 72.0 in | Wt 205.0 lb

## 2022-07-29 DIAGNOSIS — N50811 Right testicular pain: Secondary | ICD-10-CM

## 2022-07-29 DIAGNOSIS — N453 Epididymo-orchitis: Secondary | ICD-10-CM | POA: Diagnosis not present

## 2022-07-29 LAB — URINALYSIS, ROUTINE W REFLEX MICROSCOPIC
Bilirubin, UA: NEGATIVE
Glucose, UA: NEGATIVE
Ketones, UA: NEGATIVE
Leukocytes,UA: NEGATIVE
Nitrite, UA: NEGATIVE
Protein,UA: NEGATIVE
RBC, UA: NEGATIVE
Specific Gravity, UA: 1.03 (ref 1.005–1.030)
Urobilinogen, Ur: 0.2 mg/dL (ref 0.2–1.0)
pH, UA: 5 (ref 5.0–7.5)

## 2022-07-29 NOTE — Progress Notes (Unsigned)
07/29/2022 10:38 AM   Traci Sermon [birth year] [phone]  Referring provider: Curlene Labrum, MD [place],  [place] [address]  [place] testicular pain   HPI: Mr Hector Kelley is a 76yo here for followup after hospital discharge for right epididymal orchitis. He was admitted with epididynmo-orchitis and discharged Saturday on cipro. The pain in the right testis is improving. His right testis remains firm. He denies any significant LUTS after simple prostatectomy.   PMH: Past Medical History:  Diagnosis Date   Arthritis    BPH with obstruction/lower urinary tract symptoms    Complication of anesthesia    urine retention, requiring catheter after anesthesia   Depression    Diverticulosis    H/O clavicle fracture    Right   Hypothyroidism    Wears contact lenses     Surgical History: Past Surgical History:  Procedure Laterality Date   BACK SURGERY  1980   Lumbar Laminectomy    CHOLECYSTECTOMY     CIRCUMCISION     COLONOSCOPY     KNEE ARTHROSCOPY Left    2016   KNEE ARTHROSCOPY AND ARTHROTOMY Right 1980   XI ROBOTIC ASSISTED SIMPLE PROSTATECTOMY N/A 02/14/2019   Procedure: XI ROBOTIC ASSISTED SIMPLE PROSTATECTOMY;  Surgeon: Cleon Gustin, MD;  Location: [facility];  Service: Urology;  Laterality: N/A;  3 HRS    Home Medications:  Allergies as of 07/29/2022   No Known Allergies      Medication List        Accurate as of July 29, 2022 10:38 AM. If you have any questions, ask your nurse or doctor.          ARTIFICIAL TEARS OP Place 1 drop into both eyes daily as needed (dry eyes).   aspirin EC 81 MG tablet Take 81 mg by mouth daily. Swallow whole.   cholecalciferol 25 MCG (1000 UNIT) tablet Commonly known as: VITAMIN D3 Take 1,000 Units by mouth daily.   ciprofloxacin 500 MG tablet Commonly known as: Cipro Take 1 tablet (500 mg total) by mouth 2 (two) times daily for 12 days.   glucosamine-chondroitin 500-400 MG tablet Take 1 tablet by  mouth daily.   HYDROcodone-acetaminophen 5-325 MG tablet Commonly known as: NORCO/VICODIN Take 1 tablet by mouth every 6 (six) hours as needed for moderate pain or severe pain.   levothyroxine 125 MCG tablet Commonly known as: SYNTHROID Take 125 mcg by mouth daily before breakfast.   MULTIVITAMIN ADULT PO Take 1 tablet by mouth daily.        Allergies: No Known Allergies  Family History: No family history on file.  Social History:  reports that he has never smoked. He has never used smokeless tobacco. He reports that he does not drink alcohol and does not use drugs.  ROS: All other review of systems were reviewed and are negative except what is noted above in HPI  Physical Exam: BP 107/70   Pulse 61   Ht 6' (1.829 m)   Wt 205 lb (93 kg)   BMI 27.80 kg/m   Constitutional:  Alert and oriented, No acute distress. HEENT: [place] AT, moist mucus membranes.  Trachea midline, no masses. Cardiovascular: No clubbing, cyanosis, or edema. Respiratory: Normal respiratory effort, no increased work of breathing. GI: Abdomen is soft, nontender, nondistended, no abdominal masses GU: No CVA tenderness. Circumcised phallus. No masses/lesions on penis, testis, scrotum. Prostate 40g smooth no nodules no induration.  Lymph: No cervical or inguinal lymphadenopathy. Skin: No rashes, bruises or  suspicious lesions. Neurologic: Grossly intact, no focal deficits, moving all 4 extremities. Psychiatric: Normal mood and affect.  Laboratory Data: Lab Results  Component Value Date   WBC 11.6 (H) 07/23/2022   HGB 12.2 (L) 07/23/2022   HCT 37.4 (L) 07/23/2022   MCV 94.4 07/23/2022   PLT 319 07/23/2022    Lab Results  Component Value Date   CREATININE 0.92 07/23/2022    No results found for: "PSA"  No results found for: "TESTOSTERONE"  No results found for: "HGBA1C"  Urinalysis    Component Value Date/Time   COLORURINE YELLOW 07/20/2022 1800   APPEARANCEUR HAZY (A) 07/20/2022 1800    APPEARANCEUR Clear 09/27/2021 0958   LABSPEC 1.030 07/20/2022 1800   PHURINE 5.0 07/20/2022 1800   GLUCOSEU NEGATIVE 07/20/2022 1800   HGBUR LARGE (A) 07/20/2022 1800   BILIRUBINUR NEGATIVE 07/20/2022 1800   BILIRUBINUR Negative 09/27/2021 0958   KETONESUR NEGATIVE 07/20/2022 1800   PROTEINUR 100 (A) 07/20/2022 1800   NITRITE NEGATIVE 07/20/2022 1800   LEUKOCYTESUR SMALL (A) 07/20/2022 1800    Lab Results  Component Value Date   LABMICR See below: 09/27/2021   WBCUA 0-5 09/27/2021   LABEPIT 0-10 09/27/2021   BACTERIA FEW (A) 07/20/2022    Pertinent Imaging:  No results found for this or any previous visit.  No results found for this or any previous visit.  No results found for this or any previous visit.  No results found for this or any previous visit.  No results found for this or any previous visit.  No valid procedures specified. No results found for this or any previous visit.  No results found for this or any previous visit.   Assessment & Plan:    1. Pain in right testicle -improving on cipro  2. Epididymo-orchitis Patient should continue cipro '500mg'$  BID for a total of 2 weeks   No follow-ups on file.  Nicolette Bang, MD  [facility] [place]

## 2022-07-29 NOTE — Patient Instructions (Signed)
Orchitis  Orchitis is inflammation of a testicle. Testicles are the male organs that produce sperm. The testicles are held in a fleshy sac (scrotum) located behind the penis. Orchitis usually affects only one testicle, but it can affect both. Orchitis is caused by infection. Many kinds of bacteria and viruses can cause this infection. The condition can develop suddenly. What are the causes? This condition may be caused by: Infection from viruses or bacteria. Other organisms, such as fungi or parasites. This is rare but can happen in men who have a weak body defense system (immune system), such as men who have HIV. Bacteria  Bacterial orchitis often occurs along with an infection of the tube that collects and stores sperm (epididymis). In men who are not sexually active, this infection usually starts as a urinary tract infection and spreads to the testicle. In sexually active men, sexually transmitted infections (STIs) are the most common cause of bacterial orchitis. These can include: Gonorrhea. Chlamydia. Viruses Mumps is the most common cause of viral orchitis, though mumps is now rare in many areas because of vaccination. Other viruses that can cause orchitis include: The chickenpox virus (varicella-zoster virus). The virus that causes mononucleosis (Epstein-Barr virus). What increases the risk? The following factors may make you more likely to develop this condition: For viral orchitis: Not having been vaccinated against mumps. For bacterial orchitis: Having had frequent urinary tract infections. Engaging in high-risk sexual behaviors, such as having multiple sexual partners or having sex without using a condom. Having a sexual partner with an STI. Having had urinary tract surgery. Using a tube that is passed through the penis to drain urine (Foley catheter). Having an enlarged prostate gland. What are the signs or symptoms? The most common symptoms of orchitis are swelling and  pain in the scrotum. Other signs and symptoms may include: Feeling generally sick (malaise). Fever and chills. Painful urination. Painful ejaculation. Headache. Fatigue. Nausea. Blood or discharge from the penis. Swollen lymph nodes in the groin area (inguinal nodes). How is this diagnosed? This condition may be diagnosed based on: Your symptoms. Your health care provider may suspect orchitis if you have a painful, swollen testicle along with other signs and symptoms of the condition. A physical exam. You may also have other tests, including: A blood test to check for signs of infection. A urine test to check for a urinary tract infection or STI. Using a swab to collect a fluid sample from the tip of the penis to test for STIs. Taking an image of the testicle using sound waves and a computer (testicular ultrasound). How is this treated? Treatment for this condition depends on the cause.  For bacterial orchitis, your health care provider may prescribe antibiotic medicines. Bacterial infections usually clear up within a few days. For both viral infections and bacterial infections, treatment may include: Rest. Anti-inflammatory medicines. Pain medicines. Raising (elevating) the scrotum with a towel or pillow and applying ice. Follow these instructions at home: Managing pain and swelling Elevate your scrotum and apply ice as directed. To do this: Put ice in a plastic bag. Place a small towel or pillow between your legs. Rest your scrotum on the pillow or towel. Place another towel between your skin and the plastic bag. Leave the ice on for 20 minutes, 2-3 times a day. Remove the ice if your skin turns bright red. This is very important. If you cannot feel pain, heat, or cold, you have a greater risk of damage to the area. General instructions   Rest as told by your health care provider. Take over-the-counter and prescription medicines only as told by your health care provider. If you  were prescribed an antibiotic medicine, take it as told by your health care provider. Do not stop taking the antibiotic even if you start to feel better. Do not have sex until your health care provider says it is okay to do so. Keep all follow-up visits. This is important. Contact a health care provider if: You have a fever. Pain and swelling have not gotten better after 3 days. Get help right away if: Your pain is getting worse. The swelling in your testicle gets worse. Summary Orchitis is inflammation of a testicle. It is caused by an infection from bacteria or a virus. The most common symptoms of orchitis are swelling and pain in the scrotum. Treatment for this condition depends on the cause. It may include medicines to fight the infection, reduce inflammation, and relieve the pain. Follow your health care provider's instructions about resting, icing, not having sex, and taking medicines. This information is not intended to replace advice given to you by your health care provider. Make sure you discuss any questions you have with your health care provider. Document Revised: 03/16/2021 Document Reviewed: 03/16/2021 Elsevier Patient Education  2023 Elsevier Inc.  

## 2022-08-03 DIAGNOSIS — I959 Hypotension, unspecified: Secondary | ICD-10-CM | POA: Diagnosis not present

## 2022-08-03 DIAGNOSIS — N200 Calculus of kidney: Secondary | ICD-10-CM | POA: Diagnosis not present

## 2022-08-03 DIAGNOSIS — Z6828 Body mass index (BMI) 28.0-28.9, adult: Secondary | ICD-10-CM | POA: Diagnosis not present

## 2022-08-03 DIAGNOSIS — N453 Epididymo-orchitis: Secondary | ICD-10-CM | POA: Diagnosis not present

## 2022-08-03 DIAGNOSIS — I700 Atherosclerosis of aorta: Secondary | ICD-10-CM | POA: Diagnosis not present

## 2022-08-03 DIAGNOSIS — N179 Acute kidney failure, unspecified: Secondary | ICD-10-CM | POA: Diagnosis not present

## 2022-08-09 DIAGNOSIS — D691 Qualitative platelet defects: Secondary | ICD-10-CM | POA: Diagnosis not present

## 2022-08-22 DIAGNOSIS — N179 Acute kidney failure, unspecified: Secondary | ICD-10-CM | POA: Diagnosis not present

## 2022-08-22 DIAGNOSIS — I959 Hypotension, unspecified: Secondary | ICD-10-CM | POA: Diagnosis not present

## 2022-08-22 DIAGNOSIS — N200 Calculus of kidney: Secondary | ICD-10-CM | POA: Diagnosis not present

## 2022-08-22 DIAGNOSIS — I700 Atherosclerosis of aorta: Secondary | ICD-10-CM | POA: Diagnosis not present

## 2022-08-22 DIAGNOSIS — N453 Epididymo-orchitis: Secondary | ICD-10-CM | POA: Diagnosis not present

## 2022-09-01 DIAGNOSIS — E039 Hypothyroidism, unspecified: Secondary | ICD-10-CM | POA: Diagnosis not present

## 2022-09-01 DIAGNOSIS — E782 Mixed hyperlipidemia: Secondary | ICD-10-CM | POA: Diagnosis not present

## 2022-09-01 DIAGNOSIS — E7849 Other hyperlipidemia: Secondary | ICD-10-CM | POA: Diagnosis not present

## 2022-09-01 DIAGNOSIS — R972 Elevated prostate specific antigen [PSA]: Secondary | ICD-10-CM | POA: Diagnosis not present

## 2022-09-01 DIAGNOSIS — Z1159 Encounter for screening for other viral diseases: Secondary | ICD-10-CM | POA: Diagnosis not present

## 2022-09-05 DIAGNOSIS — R030 Elevated blood-pressure reading, without diagnosis of hypertension: Secondary | ICD-10-CM | POA: Diagnosis not present

## 2022-09-05 DIAGNOSIS — E039 Hypothyroidism, unspecified: Secondary | ICD-10-CM | POA: Diagnosis not present

## 2022-09-05 DIAGNOSIS — R972 Elevated prostate specific antigen [PSA]: Secondary | ICD-10-CM | POA: Diagnosis not present

## 2022-09-05 DIAGNOSIS — Z23 Encounter for immunization: Secondary | ICD-10-CM | POA: Diagnosis not present

## 2022-09-05 DIAGNOSIS — N401 Enlarged prostate with lower urinary tract symptoms: Secondary | ICD-10-CM | POA: Diagnosis not present

## 2022-09-05 DIAGNOSIS — Z6828 Body mass index (BMI) 28.0-28.9, adult: Secondary | ICD-10-CM | POA: Diagnosis not present

## 2022-09-05 DIAGNOSIS — E7849 Other hyperlipidemia: Secondary | ICD-10-CM | POA: Diagnosis not present

## 2022-09-28 ENCOUNTER — Ambulatory Visit: Payer: Medicare HMO | Admitting: Urology

## 2022-09-28 ENCOUNTER — Encounter: Payer: Self-pay | Admitting: Urology

## 2022-09-28 VITALS — BP 138/87 | HR 73

## 2022-09-28 DIAGNOSIS — N138 Other obstructive and reflux uropathy: Secondary | ICD-10-CM

## 2022-09-28 DIAGNOSIS — R3912 Poor urinary stream: Secondary | ICD-10-CM

## 2022-09-28 DIAGNOSIS — R972 Elevated prostate specific antigen [PSA]: Secondary | ICD-10-CM | POA: Diagnosis not present

## 2022-09-28 DIAGNOSIS — N401 Enlarged prostate with lower urinary tract symptoms: Secondary | ICD-10-CM

## 2022-09-28 LAB — URINALYSIS, ROUTINE W REFLEX MICROSCOPIC
Bilirubin, UA: NEGATIVE
Glucose, UA: NEGATIVE
Ketones, UA: NEGATIVE
Leukocytes,UA: NEGATIVE
Nitrite, UA: NEGATIVE
Protein,UA: NEGATIVE
Specific Gravity, UA: 1.01 (ref 1.005–1.030)
Urobilinogen, Ur: 0.2 mg/dL (ref 0.2–1.0)
pH, UA: 5.5 (ref 5.0–7.5)

## 2022-09-28 LAB — MICROSCOPIC EXAMINATION: Bacteria, UA: NONE SEEN

## 2022-09-28 NOTE — Progress Notes (Signed)
09/28/2022 10:30 AM   Traci Sermon 1946-04-21 [id number]  Referring provider: Curlene Labrum, MD [place],  [place] [address]  Followup elevated PSa and BPH   HPI: Mr Hector Kelley is a 77yo here for followup for elevated PSA and BPH. PSA 3.1 per patient which was drawn at Dr. Lizbeth Bark office. IPSS 4 QOL 0 after simple prostatectomy. Urine stream strong. No straining to urinate. Noctruia 1-2x. His testicular pain resolved after antibiotic therapy.   PMH: Past Medical History:  Diagnosis Date   Arthritis    BPH with obstruction/lower urinary tract symptoms    Complication of anesthesia    urine retention, requiring catheter after anesthesia   Depression    Diverticulosis    H/O clavicle fracture    Right   Hypothyroidism    Wears contact lenses     Surgical History: Past Surgical History:  Procedure Laterality Date   BACK SURGERY  1980   Lumbar Laminectomy    CHOLECYSTECTOMY     CIRCUMCISION     COLONOSCOPY     KNEE ARTHROSCOPY Left    2016   KNEE ARTHROSCOPY AND ARTHROTOMY Right 1980   XI ROBOTIC ASSISTED SIMPLE PROSTATECTOMY N/A 02/14/2019   Procedure: XI ROBOTIC ASSISTED SIMPLE PROSTATECTOMY;  Surgeon: Cleon Gustin, MD;  Location: [facility];  Service: Urology;  Laterality: N/A;  3 HRS    Home Medications:  Allergies as of 09/28/2022   No Known Allergies      Medication List        Accurate as of September 28, 2022 10:30 AM. If you have any questions, ask your nurse or doctor.          ARTIFICIAL TEARS OP Place 1 drop into both eyes daily as needed (dry eyes).   aspirin EC 81 MG tablet Take 81 mg by mouth daily. Swallow whole.   cholecalciferol 25 MCG (1000 UNIT) tablet Commonly known as: VITAMIN D3 Take 1,000 Units by mouth daily.   glucosamine-chondroitin 500-400 MG tablet Take 1 tablet by mouth daily.   HYDROcodone-acetaminophen 5-325 MG tablet Commonly known as: NORCO/VICODIN Take 1 tablet by mouth every 6 (six) hours as needed  for moderate pain or severe pain.   levothyroxine 125 MCG tablet Commonly known as: SYNTHROID Take 125 mcg by mouth daily before breakfast.   MULTIVITAMIN ADULT PO Take 1 tablet by mouth daily.        Allergies: No Known Allergies  Family History: No family history on file.  Social History:  reports that he has never smoked. He has never used smokeless tobacco. He reports that he does not drink alcohol and does not use drugs.  ROS: All other review of systems were reviewed and are negative except what is noted above in HPI  Physical Exam: BP 138/87   Pulse 73   Constitutional:  Alert and oriented, No acute distress. HEENT: [place] AT, moist mucus membranes.  Trachea midline, no masses. Cardiovascular: No clubbing, cyanosis, or edema. Respiratory: Normal respiratory effort, no increased work of breathing. GI: Abdomen is soft, nontender, nondistended, no abdominal masses GU: No CVA tenderness.  Lymph: No cervical or inguinal lymphadenopathy. Skin: No rashes, bruises or suspicious lesions. Neurologic: Grossly intact, no focal deficits, moving all 4 extremities. Psychiatric: Normal mood and affect.  Laboratory Data: Lab Results  Component Value Date   WBC 11.6 (H) 07/23/2022   HGB 12.2 (L) 07/23/2022   HCT 37.4 (L) 07/23/2022   MCV 94.4 07/23/2022   PLT 319 07/23/2022  Lab Results  Component Value Date   CREATININE 0.92 07/23/2022    No results found for: "PSA"  No results found for: "TESTOSTERONE"  No results found for: "HGBA1C"  Urinalysis    Component Value Date/Time   COLORURINE YELLOW 07/20/2022 1800   APPEARANCEUR Clear 07/29/2022 1121   LABSPEC 1.030 07/20/2022 1800   PHURINE 5.0 07/20/2022 1800   GLUCOSEU Negative 07/29/2022 1121   HGBUR LARGE (A) 07/20/2022 1800   BILIRUBINUR Negative 07/29/2022 1121   KETONESUR NEGATIVE 07/20/2022 1800   PROTEINUR Negative 07/29/2022 1121   PROTEINUR 100 (A) 07/20/2022 1800   NITRITE Negative 07/29/2022 1121    NITRITE NEGATIVE 07/20/2022 1800   LEUKOCYTESUR Negative 07/29/2022 1121   LEUKOCYTESUR SMALL (A) 07/20/2022 1800    Lab Results  Component Value Date   LABMICR Comment 07/29/2022   WBCUA 0-5 09/27/2021   LABEPIT 0-10 09/27/2021   BACTERIA FEW (A) 07/20/2022    Pertinent Imaging:  No results found for this or any previous visit.  No results found for this or any previous visit.  No results found for this or any previous visit.  No results found for this or any previous visit.  No results found for this or any previous visit.  No valid procedures specified. No results found for this or any previous visit.  No results found for this or any previous visit.   Assessment & Plan:    1. Elevated PSA -Followup 1 year with PSA - Urinalysis, Routine w reflex microscopic  2. Weak urinary stream -resolved after simple prostatectomy  3. BPH with obstruction/lower urinary tract symptoms -improved after simple prostatectomy.   No follow-ups on file.  Nicolette Bang, MD  [facility] [place]

## 2022-09-28 NOTE — Patient Instructions (Signed)

## 2022-10-04 DIAGNOSIS — L570 Actinic keratosis: Secondary | ICD-10-CM | POA: Diagnosis not present

## 2022-10-04 DIAGNOSIS — L308 Other specified dermatitis: Secondary | ICD-10-CM | POA: Diagnosis not present

## 2022-10-04 DIAGNOSIS — D224 Melanocytic nevi of scalp and neck: Secondary | ICD-10-CM | POA: Diagnosis not present

## 2022-10-04 DIAGNOSIS — D485 Neoplasm of uncertain behavior of skin: Secondary | ICD-10-CM | POA: Diagnosis not present

## 2022-10-18 DIAGNOSIS — L309 Dermatitis, unspecified: Secondary | ICD-10-CM | POA: Diagnosis not present

## 2022-10-18 DIAGNOSIS — L570 Actinic keratosis: Secondary | ICD-10-CM | POA: Diagnosis not present

## 2023-03-21 DIAGNOSIS — L570 Actinic keratosis: Secondary | ICD-10-CM | POA: Diagnosis not present

## 2023-03-21 DIAGNOSIS — L280 Lichen simplex chronicus: Secondary | ICD-10-CM | POA: Diagnosis not present

## 2023-03-21 DIAGNOSIS — Z1283 Encounter for screening for malignant neoplasm of skin: Secondary | ICD-10-CM | POA: Diagnosis not present

## 2023-03-21 DIAGNOSIS — D485 Neoplasm of uncertain behavior of skin: Secondary | ICD-10-CM | POA: Diagnosis not present

## 2023-05-10 DIAGNOSIS — G8929 Other chronic pain: Secondary | ICD-10-CM | POA: Diagnosis not present

## 2023-05-10 DIAGNOSIS — M170 Bilateral primary osteoarthritis of knee: Secondary | ICD-10-CM | POA: Diagnosis not present

## 2023-05-10 DIAGNOSIS — M25561 Pain in right knee: Secondary | ICD-10-CM | POA: Diagnosis not present

## 2023-05-10 DIAGNOSIS — M25562 Pain in left knee: Secondary | ICD-10-CM | POA: Diagnosis not present

## 2023-09-21 ENCOUNTER — Other Ambulatory Visit: Payer: Medicare HMO

## 2023-09-25 ENCOUNTER — Ambulatory Visit: Payer: Medicare HMO | Admitting: Urology

## 2023-10-16 ENCOUNTER — Telehealth: Payer: Self-pay

## 2023-10-16 NOTE — Telephone Encounter (Signed)
Pt went to [facility] in [place] and [facility] in [place] wanted MD McKenzie to have his PSA labs from both places appt on 02/12 Pt confirmed he will be there

## 2023-11-01 ENCOUNTER — Ambulatory Visit: Payer: Medicare HMO | Admitting: Urology

## 2023-11-01 VITALS — BP 165/89 | HR 67

## 2023-11-01 DIAGNOSIS — R972 Elevated prostate specific antigen [PSA]: Secondary | ICD-10-CM | POA: Diagnosis not present

## 2023-11-01 DIAGNOSIS — R3912 Poor urinary stream: Secondary | ICD-10-CM

## 2023-11-01 DIAGNOSIS — N138 Other obstructive and reflux uropathy: Secondary | ICD-10-CM

## 2023-11-01 LAB — URINALYSIS, ROUTINE W REFLEX MICROSCOPIC
Bilirubin, UA: NEGATIVE
Glucose, UA: NEGATIVE
Ketones, UA: NEGATIVE
Leukocytes,UA: NEGATIVE
Nitrite, UA: NEGATIVE
Protein,UA: NEGATIVE
RBC, UA: NEGATIVE
Specific Gravity, UA: 1.025 (ref 1.005–1.030)
Urobilinogen, Ur: 0.2 mg/dL (ref 0.2–1.0)
pH, UA: 5.5 (ref 5.0–7.5)

## 2023-11-01 NOTE — Progress Notes (Signed)
 11/01/2023 11:05 AM   Hector Kelley Apr 02, 1946 [id number]  Referring provider: Juliette Alcide, MD [address] [place],  [place] [address]  Elevated PSA   HPI: Hector Kelley is a 77yo here for followup for elevated PSA and BPH. PSA increased to 5.4 from 3.1. PSA was 4.9 in 08/2023. He denies ay worsening LUTS. IPSS 3 QOL 1 after simple prostatectomy. Nocturia 2-3x.    PMH: Past Medical History:  Diagnosis Date   Arthritis    BPH with obstruction/lower urinary tract symptoms    Complication of anesthesia    urine retention, requiring catheter after anesthesia   Depression    Diverticulosis    H/O clavicle fracture    Right   Hypothyroidism    Wears contact lenses     Surgical History: Past Surgical History:  Procedure Laterality Date   BACK SURGERY  1980   Lumbar Laminectomy    CHOLECYSTECTOMY     CIRCUMCISION     COLONOSCOPY     KNEE ARTHROSCOPY Left    2016   KNEE ARTHROSCOPY AND ARTHROTOMY Right 1980   XI ROBOTIC ASSISTED SIMPLE PROSTATECTOMY N/A 02/14/2019   Procedure: XI ROBOTIC ASSISTED SIMPLE PROSTATECTOMY;  Surgeon: Malen Gauze, MD;  Location: [facility];  Service: Urology;  Laterality: N/A;  3 HRS    Home Medications:  Allergies as of 11/01/2023   No Known Allergies      Medication List        Accurate as of November 01, 2023 11:05 AM. If you have any questions, ask your nurse or doctor.          ARTIFICIAL TEARS OP Place 1 drop into both eyes daily as needed (dry eyes).   aspirin EC 81 MG tablet Take 81 mg by mouth daily. Swallow whole.   cholecalciferol 25 MCG (1000 UNIT) tablet Commonly known as: VITAMIN D3 Take 1,000 Units by mouth daily.   glucosamine-chondroitin 500-400 MG tablet Take 1 tablet by mouth daily.   HYDROcodone-acetaminophen 5-325 MG tablet Commonly known as: NORCO/VICODIN Take 1 tablet by mouth every 6 (six) hours as needed for moderate pain or severe pain.   levothyroxine 125 MCG tablet Commonly known as:  SYNTHROID Take 125 mcg by mouth daily before breakfast.   MULTIVITAMIN ADULT PO Take 1 tablet by mouth daily.        Allergies: No Known Allergies  Family History: No family history on file.  Social History:  reports that he has never smoked. He has never used smokeless tobacco. He reports that he does not drink alcohol and does not use drugs.  ROS: All other review of systems were reviewed and are negative except what is noted above in HPI  Physical Exam: BP (!) 165/89   Pulse 67   Constitutional:  Alert and oriented, No acute distress. HEENT: [place] AT, moist mucus membranes.  Trachea midline, no masses. Cardiovascular: No clubbing, cyanosis, or edema. Respiratory: Normal respiratory effort, no increased work of breathing. GI: Abdomen is soft, nontender, nondistended, no abdominal masses GU: No CVA tenderness.  Lymph: No cervical or inguinal lymphadenopathy. Skin: No rashes, bruises or suspicious lesions. Neurologic: Grossly intact, no focal deficits, moving all 4 extremities. Psychiatric: Normal mood and affect.  Laboratory Data: Lab Results  Component Value Date   WBC 11.6 (H) 07/23/2022   HGB 12.2 (L) 07/23/2022   HCT 37.4 (L) 07/23/2022   MCV 94.4 07/23/2022   PLT 319 07/23/2022    Lab Results  Component Value Date  CREATININE 0.92 07/23/2022    No results found for: "PSA"  No results found for: "TESTOSTERONE"  No results found for: "HGBA1C"  Urinalysis    Component Value Date/Time   COLORURINE YELLOW 07/20/2022 1800   APPEARANCEUR Clear 09/28/2022 1004   LABSPEC 1.030 07/20/2022 1800   PHURINE 5.0 07/20/2022 1800   GLUCOSEU Negative 09/28/2022 1004   HGBUR LARGE (A) 07/20/2022 1800   BILIRUBINUR Negative 09/28/2022 1004   KETONESUR NEGATIVE 07/20/2022 1800   PROTEINUR Negative 09/28/2022 1004   PROTEINUR 100 (A) 07/20/2022 1800   NITRITE Negative 09/28/2022 1004   NITRITE NEGATIVE 07/20/2022 1800   LEUKOCYTESUR Negative 09/28/2022 1004    LEUKOCYTESUR SMALL (A) 07/20/2022 1800    Lab Results  Component Value Date   LABMICR See below: 09/28/2022   WBCUA 0-5 09/28/2022   LABEPIT 0-10 09/28/2022   BACTERIA None seen 09/28/2022    Pertinent Imaging:  No results found for this or any previous visit.  No results found for this or any previous visit.  No results found for this or any previous visit.  No results found for this or any previous visit.  No results found for this or any previous visit.  No results found for this or any previous visit.  No results found for this or any previous visit.  No results found for this or any previous visit.   Assessment & Plan:    1. Elevated PSA (Primary) IsoPSA - Urinalysis, Routine w reflex microscopic  2. BPH with obstruction/lower urinary tract symptoms Patient defers therapy at this time  3. Weak urinary stream Patient defers therapy at this time   No follow-ups on file.  Wilkie Aye, MD  [facility] [place]

## 2023-11-09 ENCOUNTER — Telehealth: Payer: Self-pay | Admitting: Urology

## 2023-11-09 DIAGNOSIS — R972 Elevated prostate specific antigen [PSA]: Secondary | ICD-10-CM

## 2023-11-09 NOTE — Telephone Encounter (Signed)
Would like results from blood work last week

## 2023-11-09 NOTE — Telephone Encounter (Signed)
Patient called and given results of Iso Psa and Dr. Dimas Millin recommendation for prostate MRI.  Orders placed. Patient voiced understanding.

## 2023-11-09 NOTE — Telephone Encounter (Signed)
Return call to patient. Patient is made aware once Dr. Ronne Binning review his labs someone will reach out with Dr. Ronne Binning response. Patient voiced understanding.

## 2023-11-14 ENCOUNTER — Encounter: Payer: Self-pay | Admitting: Urology

## 2023-11-14 NOTE — Patient Instructions (Signed)

## 2023-11-27 ENCOUNTER — Ambulatory Visit (HOSPITAL_COMMUNITY)
Admission: RE | Admit: 2023-11-27 | Discharge: 2023-11-27 | Disposition: A | Discharge status: Home / Self Care | Payer: Medicare HMO | Source: Ambulatory Visit | Attending: Urology | Admitting: Urology

## 2023-11-27 DIAGNOSIS — K5730 Diverticulosis of large intestine without perforation or abscess without bleeding: Secondary | ICD-10-CM | POA: Diagnosis not present

## 2023-11-27 DIAGNOSIS — N4289 Other specified disorders of prostate: Secondary | ICD-10-CM | POA: Diagnosis not present

## 2023-11-27 DIAGNOSIS — R972 Elevated prostate specific antigen [PSA]: Secondary | ICD-10-CM | POA: Diagnosis not present

## 2023-11-27 MED ORDER — GADOBUTROL 1 MMOL/ML IV SOLN
10.0000 mL | Freq: Once | INTRAVENOUS | Status: AC | PRN
  Administered 2023-11-27: 10 mL via INTRAVENOUS

## 2023-12-01 ENCOUNTER — Telehealth: Payer: Self-pay | Admitting: Urology

## 2023-12-01 NOTE — Telephone Encounter (Signed)
 Wants results of MRI

## 2023-12-04 NOTE — Telephone Encounter (Signed)
 What is scanned under Media listed as MRI is not the actual report. Has [facility] faxed over the report?

## 2023-12-04 NOTE — Telephone Encounter (Signed)
 Patient called again wants you to call  him with MRI  results, he has waited for over a week for them

## 2023-12-11 ENCOUNTER — Telehealth: Payer: Self-pay

## 2023-12-11 DIAGNOSIS — R972 Elevated prostate specific antigen [PSA]: Secondary | ICD-10-CM

## 2023-12-11 NOTE — Telephone Encounter (Signed)
 See other encounter.  Pt scheduled for fusion MRI per Dr. Ronne Binning

## 2023-12-11 NOTE — Telephone Encounter (Signed)
 Called Pt to give his results Per verbal from MD McKenzie "Pt has about 40% chance of cancer out of 100% but would like to schedule him w/ [facility] for a Fusion Biopsy" Pt advised that the referral was placed and should hear from [facility] for a consult and further instructions

## 2023-12-11 NOTE — Telephone Encounter (Signed)
 Pt called requesting MRI results advised that someone will be in touch as soon as the results are read

## 2023-12-11 NOTE — Addendum Note (Signed)
 Addended by: Sarajane Jews on: 12/11/2023 03:26 PM   Modules accepted: Orders

## 2023-12-12 ENCOUNTER — Telehealth: Payer: Self-pay

## 2023-12-12 NOTE — Telephone Encounter (Signed)
 Pt calling with questions about his MRI wants to know did he have a spot that he needs to be worried about and would [facility] reach out to him Pt advised and reiterated on previous telephone encounter as well as advised that [facility] would reach out to him to schedule his upcoming fusion biopsy

## 2023-12-19 DIAGNOSIS — L570 Actinic keratosis: Secondary | ICD-10-CM | POA: Diagnosis not present

## 2023-12-19 DIAGNOSIS — L814 Other melanin hyperpigmentation: Secondary | ICD-10-CM | POA: Diagnosis not present

## 2023-12-19 DIAGNOSIS — L281 Prurigo nodularis: Secondary | ICD-10-CM | POA: Diagnosis not present

## 2023-12-27 DIAGNOSIS — R972 Elevated prostate specific antigen [PSA]: Secondary | ICD-10-CM | POA: Diagnosis not present

## 2023-12-27 DIAGNOSIS — C61 Malignant neoplasm of prostate: Secondary | ICD-10-CM | POA: Diagnosis not present

## 2024-01-03 ENCOUNTER — Telehealth: Payer: Self-pay

## 2024-01-03 NOTE — Telephone Encounter (Signed)
 Patient requesting MRI results. Patient made aware the MD is on vacation this week and will not return to office until next Wednesday. Patient voiced that he did not want to wait that long for his results. Patient informed that a message would be sent to providers NP who is scheduled to return tomorrow to see if she would respond to the results. If not it would be when MD returns. Patient voiced understanding.

## 2024-01-04 ENCOUNTER — Telehealth: Payer: Self-pay | Admitting: Urology

## 2024-01-04 NOTE — Telephone Encounter (Signed)
 Pt called back and let us  know that MD Bell from [facility] did his biopsy pt was advised that its possible that [facility] should have his biopsy results but MD McKenzie will be back in office on Monday if [facility] is unable to given his results

## 2024-01-04 NOTE — Telephone Encounter (Signed)
 Patient is upset that he has not gotten MRI results. He hung up on me

## 2024-01-04 NOTE — Telephone Encounter (Signed)
 Called pt to reiterate message from previous encounter pt did not answer lvm to call back

## 2024-01-04 NOTE — Telephone Encounter (Signed)
 Pt was called lvm to c/b

## 2024-01-04 NOTE — Telephone Encounter (Signed)
Wants results of biopsy

## 2024-01-09 NOTE — Telephone Encounter (Signed)
 Report sent to MD to review.

## 2024-01-09 NOTE — Telephone Encounter (Signed)
 Patient called and said [facility] sent results for biopsy on the 9th. He would like a call asap about results.

## 2024-01-09 NOTE — Telephone Encounter (Signed)
 Patient called and scheduled for biopsy talk on 01/12/24

## 2024-01-12 ENCOUNTER — Encounter: Payer: Self-pay | Admitting: Urology

## 2024-01-12 ENCOUNTER — Ambulatory Visit: Admitting: Urology

## 2024-01-12 VITALS — BP 145/84 | HR 83

## 2024-01-12 DIAGNOSIS — C61 Malignant neoplasm of prostate: Secondary | ICD-10-CM

## 2024-01-12 NOTE — Progress Notes (Signed)
 01/12/2024 1:17 PM   Conley Deer December 08, 1945 [id number]  Referring provider: Alston Jerry, MD [address] [place],  [place] [address]  No chief complaint on file.   HPI: Mr Belter is an 78yo here for followup after prostate biopsy. Biopsy revealed Gleason 3+4=7 in 2/14 cores and Gleason 4+3=7 in 4/14 cores. Prostate volume 19.5. He has a hx of a simple prostatectomy.    PMH: Past Medical History:  Diagnosis Date   Arthritis    BPH with obstruction/lower urinary tract symptoms    Complication of anesthesia    urine retention, requiring catheter after anesthesia   Depression    Diverticulosis    H/O clavicle fracture    Right   Hypothyroidism    Wears contact lenses     Surgical History: Past Surgical History:  Procedure Laterality Date   BACK SURGERY  1980   Lumbar Laminectomy    CHOLECYSTECTOMY     CIRCUMCISION     COLONOSCOPY     KNEE ARTHROSCOPY Left    2016   KNEE ARTHROSCOPY AND ARTHROTOMY Right 1980   XI ROBOTIC ASSISTED SIMPLE PROSTATECTOMY N/A 02/14/2019   Procedure: XI ROBOTIC ASSISTED SIMPLE PROSTATECTOMY;  Surgeon: Marco Severs, MD;  Location: [facility];  Service: Urology;  Laterality: N/A;  3 HRS    Home Medications:  Allergies as of 01/12/2024   No Known Allergies      Medication List        Accurate as of January 12, 2024  1:17 PM. If you have any questions, ask your nurse or doctor.          ARTIFICIAL TEARS OP Place 1 drop into both eyes daily as needed (dry eyes).   aspirin  EC 81 MG tablet Take 81 mg by mouth daily. Swallow whole.   cholecalciferol  25 MCG (1000 UNIT) tablet Commonly known as: VITAMIN D3 Take 1,000 Units by mouth daily.   glucosamine-chondroitin 500-400 MG tablet Take 1 tablet by mouth daily.   HYDROcodone -acetaminophen  5-325 MG tablet Commonly known as: NORCO/VICODIN Take 1 tablet by mouth every 6 (six) hours as needed for moderate pain or severe pain.   levothyroxine  125 MCG tablet Commonly known as:  SYNTHROID  Take 125 mcg by mouth daily before breakfast.   MULTIVITAMIN ADULT PO Take 1 tablet by mouth daily.        Allergies: No Known Allergies  Family History: No family history on file.  Social History:  reports that he has never smoked. He has never used smokeless tobacco. He reports that he does not drink alcohol and does not use drugs.  ROS: All other review of systems were reviewed and are negative except what is noted above in HPI  Physical Exam: BP (!) 145/84   Pulse 83   Constitutional:  Alert and oriented, No acute distress. HEENT: [place] AT, moist mucus membranes.  Trachea midline, no masses. Cardiovascular: No clubbing, cyanosis, or edema. Respiratory: Normal respiratory effort, no increased work of breathing. GI: Abdomen is soft, nontender, nondistended, no abdominal masses GU: No CVA tenderness.  Lymph: No cervical or inguinal lymphadenopathy. Skin: No rashes, bruises or suspicious lesions. Neurologic: Grossly intact, no focal deficits, moving all 4 extremities. Psychiatric: Normal mood and affect.  Laboratory Data: Lab Results  Component Value Date   WBC 11.6 (H) 07/23/2022   HGB 12.2 (L) 07/23/2022   HCT 37.4 (L) 07/23/2022   MCV 94.4 07/23/2022   PLT 319 07/23/2022    Lab Results  Component Value Date  CREATININE 0.92 07/23/2022    No results found for: "PSA"  No results found for: "TESTOSTERONE"  No results found for: "HGBA1C"  Urinalysis    Component Value Date/Time   COLORURINE YELLOW 07/20/2022 1800   APPEARANCEUR Clear 11/01/2023 1057   LABSPEC 1.030 07/20/2022 1800   PHURINE 5.0 07/20/2022 1800   GLUCOSEU Negative 11/01/2023 1057   HGBUR LARGE (A) 07/20/2022 1800   BILIRUBINUR Negative 11/01/2023 1057   KETONESUR NEGATIVE 07/20/2022 1800   PROTEINUR Negative 11/01/2023 1057   PROTEINUR 100 (A) 07/20/2022 1800   NITRITE Negative 11/01/2023 1057   NITRITE NEGATIVE 07/20/2022 1800   LEUKOCYTESUR Negative 11/01/2023 1057    LEUKOCYTESUR SMALL (A) 07/20/2022 1800    Lab Results  Component Value Date   LABMICR Comment 11/01/2023   WBCUA 0-5 09/28/2022   LABEPIT 0-10 09/28/2022   BACTERIA None seen 09/28/2022    Pertinent Imaging:  No results found for this or any previous visit.  No results found for this or any previous visit.  No results found for this or any previous visit.  No results found for this or any previous visit.  No results found for this or any previous visit.  No results found for this or any previous visit.  No results found for this or any previous visit.  No results found for this or any previous visit.   Assessment & Plan:    1. Prostate cancer (HCC) (Primary) I discussed the natural history of intermediate risk prostate cancer with the patient and the various treatment options including active surveillance, RALP, IMRT, brachytherapy, cryotherapy, HIFU and ADT. After discussing the options the patient elects for IMRT. I will refer him to Dr. Cipriano Creeks at [facility]    No follow-ups on file.  Johnie Nailer, MD  [facility] 

## 2024-01-12 NOTE — Patient Instructions (Signed)

## 2024-01-18 ENCOUNTER — Other Ambulatory Visit: Payer: Medicare HMO

## 2024-01-19 DIAGNOSIS — C61 Malignant neoplasm of prostate: Secondary | ICD-10-CM | POA: Diagnosis not present

## 2024-01-23 ENCOUNTER — Telehealth: Payer: Self-pay | Admitting: Urology

## 2024-01-23 NOTE — Telephone Encounter (Signed)
 Does pt need ADT or will he be schedule for a procedure?  Last note says IMRT, please advise on how to schedule patient.

## 2024-01-23 NOTE — Telephone Encounter (Signed)
 Patient called he has seen Dr Cipriano Creeks in [place] and he is anxious to have procedure done.  Dr Cipriano Creeks can't do what he needs to do until you get him scheduled for procedure.

## 2024-01-24 NOTE — Telephone Encounter (Signed)
 Patient called again wanting seed implants

## 2024-01-25 NOTE — Telephone Encounter (Signed)
 I spoke with patient and gave him a tentative date of 06/02 pending MD surgical orders.  Message sent to Dr. Claretta Croft requesting surgical orders.

## 2024-01-25 NOTE — Telephone Encounter (Signed)
 Please add to surgery work que and advise on ADT

## 2024-01-29 ENCOUNTER — Ambulatory Visit: Payer: Medicare HMO | Admitting: Urology

## 2024-01-30 ENCOUNTER — Other Ambulatory Visit: Payer: Self-pay | Admitting: Urology

## 2024-01-30 DIAGNOSIS — C61 Malignant neoplasm of prostate: Secondary | ICD-10-CM

## 2024-02-01 ENCOUNTER — Other Ambulatory Visit: Payer: Self-pay

## 2024-02-01 DIAGNOSIS — C61 Malignant neoplasm of prostate: Secondary | ICD-10-CM

## 2024-02-01 MED ORDER — FLEET ENEMA RE ENEM: 1.0000 | ENEMA | Freq: Once | RECTAL | 0 refills | Status: AC

## 2024-02-13 ENCOUNTER — Telehealth: Payer: Self-pay

## 2024-02-13 ENCOUNTER — Other Ambulatory Visit: Payer: Self-pay

## 2024-02-13 ENCOUNTER — Encounter (HOSPITAL_COMMUNITY): Payer: Self-pay

## 2024-02-13 NOTE — Pre-Procedure Instructions (Signed)
 PAT visit completed with patient over the phone at this time.

## 2024-02-13 NOTE — Telephone Encounter (Signed)
 Patient called regarding pre op appt.  I informed him [facility] pre op should be reaching out this week for PAT appt, he was informed offices were closed yesterday due to the holiday.  He was advised to reach out by Wednesday if he has not heard from [facility]. Patient voiced understanding.

## 2024-02-14 ENCOUNTER — Encounter (HOSPITAL_COMMUNITY)
Admission: RE | Admit: 2024-02-14 | Discharge: 2024-02-14 | Disposition: A | Discharge status: Home / Self Care | Source: Ambulatory Visit | Attending: Urology | Admitting: Urology

## 2024-02-19 ENCOUNTER — Encounter: Payer: Self-pay | Admitting: Urology

## 2024-02-19 ENCOUNTER — Other Ambulatory Visit: Payer: Self-pay | Admitting: Urology

## 2024-02-19 ENCOUNTER — Ambulatory Visit (HOSPITAL_COMMUNITY)
Admission: RE | Admit: 2024-02-19 | Discharge: 2024-02-19 | Disposition: A | Discharge status: Home / Self Care | Source: Ambulatory Visit | Attending: Urology | Admitting: Urology

## 2024-02-19 ENCOUNTER — Ambulatory Visit (HOSPITAL_COMMUNITY)
Admission: RE | Admit: 2024-02-19 | Discharge: 2024-02-19 | Disposition: A | Discharge status: Home / Self Care | Attending: Urology | Admitting: Urology

## 2024-02-19 ENCOUNTER — Other Ambulatory Visit: Payer: Self-pay

## 2024-02-19 ENCOUNTER — Ambulatory Visit (HOSPITAL_COMMUNITY)

## 2024-02-19 ENCOUNTER — Ambulatory Visit (HOSPITAL_COMMUNITY): Admitting: Anesthesiology

## 2024-02-19 ENCOUNTER — Encounter (HOSPITAL_COMMUNITY): Payer: Self-pay | Admitting: Urology

## 2024-02-19 ENCOUNTER — Encounter (HOSPITAL_COMMUNITY)
Admission: RE | Disposition: A | Discharge status: Home / Self Care | Payer: Self-pay | Source: Home / Self Care | Attending: Urology

## 2024-02-19 DIAGNOSIS — C61 Malignant neoplasm of prostate: Secondary | ICD-10-CM

## 2024-02-19 DIAGNOSIS — E039 Hypothyroidism, unspecified: Secondary | ICD-10-CM | POA: Diagnosis not present

## 2024-02-19 DIAGNOSIS — F32A Depression, unspecified: Secondary | ICD-10-CM | POA: Insufficient documentation

## 2024-02-19 DIAGNOSIS — I10 Essential (primary) hypertension: Secondary | ICD-10-CM | POA: Diagnosis not present

## 2024-02-19 HISTORY — PX: GOLD SEED IMPLANT: SHX6343

## 2024-02-19 HISTORY — PX: SPACE OAR INSTILLATION: SHX6769

## 2024-02-19 SURGERY — INSERTION, GOLD SEEDS
Anesthesia: General | Site: Prostate

## 2024-02-19 MED ORDER — DEXAMETHASONE SODIUM PHOSPHATE 10 MG/ML IJ SOLN
INTRAMUSCULAR | Status: DC | PRN
Start: 2024-02-19 — End: 2024-02-19
  Administered 2024-02-19: 10 mg via INTRAVENOUS

## 2024-02-19 MED ORDER — LIDOCAINE 2% (20 MG/ML) 5 ML SYRINGE
INTRAMUSCULAR | Status: AC
  Filled 2024-02-19: qty 20

## 2024-02-19 MED ORDER — LACTATED RINGERS IV SOLN: INTRAVENOUS | Status: DC

## 2024-02-19 MED ORDER — FENTANYL CITRATE PF 50 MCG/ML IJ SOSY: 25.0000 ug | PREFILLED_SYRINGE | INTRAMUSCULAR | Status: DC | PRN

## 2024-02-19 MED ORDER — LACTATED RINGERS IV SOLN: INTRAVENOUS | Status: DC | PRN

## 2024-02-19 MED ORDER — SODIUM CHLORIDE (PF) 0.9 % IJ SOLN
INTRAMUSCULAR | Status: DC | PRN
  Administered 2024-02-19: 10 mL

## 2024-02-19 MED ORDER — OXYCODONE HCL 5 MG PO TABS: 5.0000 mg | ORAL_TABLET | Freq: Once | ORAL | Status: DC | PRN

## 2024-02-19 MED ORDER — ONDANSETRON HCL 4 MG/2ML IJ SOLN: 4.0000 mg | Freq: Once | INTRAMUSCULAR | Status: DC | PRN

## 2024-02-19 MED ORDER — ONDANSETRON HCL 4 MG/2ML IJ SOLN
INTRAMUSCULAR | Status: DC | PRN
  Administered 2024-02-19: 4 mg via INTRAVENOUS

## 2024-02-19 MED ORDER — ONDANSETRON HCL 4 MG/2ML IJ SOLN
INTRAMUSCULAR | Status: AC
Start: 2024-02-19 — End: ?
  Filled 2024-02-19: qty 6

## 2024-02-19 MED ORDER — FENTANYL CITRATE (PF) 100 MCG/2ML IJ SOLN
INTRAMUSCULAR | Status: DC | PRN
  Administered 2024-02-19 (×2): 25 ug via INTRAVENOUS

## 2024-02-19 MED ORDER — LIDOCAINE 2% (20 MG/ML) 5 ML SYRINGE
INTRAMUSCULAR | Status: DC | PRN
Start: 2024-02-19 — End: 2024-02-19
  Administered 2024-02-19: 100 mg via INTRAVENOUS

## 2024-02-19 MED ORDER — CHLORHEXIDINE GLUCONATE 0.12 % MT SOLN
OROMUCOSAL | Status: DC
Start: 2024-02-19 — End: 2024-02-19
  Filled 2024-02-19: qty 15

## 2024-02-19 MED ORDER — HYDROCODONE-ACETAMINOPHEN 5-325 MG PO TABS: 1.0000 | ORAL_TABLET | Freq: Four times a day (QID) | ORAL | 0 refills | Status: AC | PRN

## 2024-02-19 MED ORDER — FENTANYL CITRATE (PF) 100 MCG/2ML IJ SOLN
INTRAMUSCULAR | Status: AC
  Filled 2024-02-19: qty 2

## 2024-02-19 MED ORDER — SUCCINYLCHOLINE CHLORIDE 200 MG/10ML IV SOSY
PREFILLED_SYRINGE | INTRAVENOUS | Status: AC
  Filled 2024-02-19: qty 10

## 2024-02-19 MED ORDER — OXYCODONE HCL 5 MG/5ML PO SOLN: 5.0000 mg | Freq: Once | ORAL | Status: DC | PRN

## 2024-02-19 MED ORDER — KETOROLAC TROMETHAMINE 30 MG/ML IJ SOLN
INTRAMUSCULAR | Status: AC
  Filled 2024-02-19: qty 1

## 2024-02-19 MED ORDER — ORAL CARE MOUTH RINSE: 15.0000 mL | Freq: Once | OROMUCOSAL | Status: DC

## 2024-02-19 MED ORDER — SUCCINYLCHOLINE CHLORIDE 200 MG/10ML IV SOSY
PREFILLED_SYRINGE | INTRAVENOUS | Status: DC | PRN
  Administered 2024-02-19: 100 mg via INTRAVENOUS

## 2024-02-19 MED ORDER — 0.9 % SODIUM CHLORIDE (POUR BTL) OPTIME
TOPICAL | Status: DC | PRN
  Administered 2024-02-19: 500 mL

## 2024-02-19 MED ORDER — EPHEDRINE 5 MG/ML INJ
INTRAVENOUS | Status: AC
  Filled 2024-02-19: qty 5

## 2024-02-19 MED ORDER — GLYCOPYRROLATE PF 0.2 MG/ML IJ SOSY
PREFILLED_SYRINGE | INTRAMUSCULAR | Status: AC
  Filled 2024-02-19: qty 2

## 2024-02-19 MED ORDER — DEXAMETHASONE SODIUM PHOSPHATE 10 MG/ML IJ SOLN
INTRAMUSCULAR | Status: AC
  Filled 2024-02-19: qty 2

## 2024-02-19 MED ORDER — CEFAZOLIN SODIUM-DEXTROSE 2-4 GM/100ML-% IV SOLN
INTRAVENOUS | Status: AC
  Filled 2024-02-19: qty 100

## 2024-02-19 MED ORDER — PROPOFOL 10 MG/ML IV BOLUS
INTRAVENOUS | Status: AC
  Filled 2024-02-19: qty 20

## 2024-02-19 MED ORDER — PHENYLEPHRINE 80 MCG/ML (10ML) SYRINGE FOR IV PUSH (FOR BLOOD PRESSURE SUPPORT)
PREFILLED_SYRINGE | INTRAVENOUS | Status: AC
  Filled 2024-02-19: qty 30

## 2024-02-19 MED ORDER — ROCURONIUM BROMIDE 10 MG/ML (PF) SYRINGE
PREFILLED_SYRINGE | INTRAVENOUS | Status: AC
Start: 2024-02-19 — End: ?
  Filled 2024-02-19: qty 10

## 2024-02-19 MED ORDER — PROPOFOL 10 MG/ML IV BOLUS
INTRAVENOUS | Status: DC | PRN
  Administered 2024-02-19 (×3): 50 mg via INTRAVENOUS
  Administered 2024-02-19: 40 mg via INTRAVENOUS
  Administered 2024-02-19: 100 mg via INTRAVENOUS
  Administered 2024-02-19: 50 mg via INTRAVENOUS

## 2024-02-19 MED ORDER — SODIUM CHLORIDE (PF) 0.9 % IJ SOLN
INTRAMUSCULAR | Status: AC
  Filled 2024-02-19: qty 10

## 2024-02-19 MED ORDER — CEFAZOLIN SODIUM-DEXTROSE 2-4 GM/100ML-% IV SOLN
2.0000 g | INTRAVENOUS | Status: AC
  Administered 2024-02-19: 2 g via INTRAVENOUS

## 2024-02-19 MED ORDER — GLYCOPYRROLATE PF 0.2 MG/ML IJ SOSY
PREFILLED_SYRINGE | INTRAMUSCULAR | Status: DC | PRN
Start: 2024-02-19 — End: 2024-02-19
  Administered 2024-02-19: .2 mg via INTRAVENOUS

## 2024-02-19 MED ORDER — CHLORHEXIDINE GLUCONATE 0.12 % MT SOLN: 15.0000 mL | Freq: Once | OROMUCOSAL | Status: DC

## 2024-02-19 SURGICAL SUPPLY — 24 items
COVER BACK TABLE 60X90IN (DRAPES) ×1 IMPLANT
COVER TABLE BACK 60X90 (DRAPES) ×1 IMPLANT
DRAPE EENT ADH APERT 31X51 STR (DRAPES) ×1 IMPLANT
DRAPE LEGGINS SURG 28X43 STRL (DRAPES) ×1 IMPLANT
DRAPE U-SHAPE 47X51 STRL (DRAPES) ×1 IMPLANT
DRSG TEGADERM 4X10 (GAUZE/BANDAGES/DRESSINGS) ×1 IMPLANT
GAUZE SPONGE 4X4 12PLY STRL (GAUZE/BANDAGES/DRESSINGS) ×2 IMPLANT
GLOVE BIO SURGEON STRL SZ8 (GLOVE) ×1 IMPLANT
GLOVE BIOGEL PI IND STRL 7.0 (GLOVE) ×2 IMPLANT
GLOVE BIOGEL PI IND STRL 8 (GLOVE) ×1 IMPLANT
GOWN STRL REUS W/TWL LRG LVL3 (GOWN DISPOSABLE) ×1 IMPLANT
IMPL SPACEOAR SYSTEM 10ML (Spacer) ×1 IMPLANT
KIT TURNOVER CYSTO (KITS) ×1 IMPLANT
MARKER GOLD PRELOAD 1.2X3 (Urological Implant) ×1 IMPLANT
MARKER SKIN DUAL TIP RULER LAB (MISCELLANEOUS) ×1 IMPLANT
NS IRRIG 1000ML POUR BTL (IV SOLUTION) ×1 IMPLANT
PAD ARMBOARD POSITIONER FOAM (MISCELLANEOUS) ×2 IMPLANT
POSITIONER HEAD 8X9X4 ADT (SOFTGOODS) ×1 IMPLANT
SOL PREP POV-IOD 4OZ 10% (MISCELLANEOUS) ×1 IMPLANT
SYR 10ML LL (SYRINGE) ×1 IMPLANT
SYR CONTROL 10ML LL (SYRINGE) ×1 IMPLANT
TOWEL OR 17X26 4PK STRL BLUE (TOWEL DISPOSABLE) ×1 IMPLANT
UNDERPAD 30X36 HEAVY ABSORB (UNDERPADS AND DIAPERS) ×1 IMPLANT
WATER STERILE IRR 500ML POUR (IV SOLUTION) ×1 IMPLANT

## 2024-02-19 NOTE — Anesthesia Preprocedure Evaluation (Signed)
 Anesthesia Evaluation  Patient identified by MRN, date of birth, ID band Patient awake    Reviewed: Allergy & Precautions, H&P , NPO status , Patient's Chart, lab work & pertinent test results, reviewed documented beta blocker date and time   History of Anesthesia Complications (+) history of anesthetic complications  Airway Mallampati: II  TM Distance: >3 FB Neck ROM: full    Dental no notable dental hx.    Pulmonary neg pulmonary ROS   Pulmonary exam normal breath sounds clear to auscultation       Cardiovascular Exercise Tolerance: Good hypertension, negative cardio ROS  Rhythm:regular Rate:Normal     Neuro/Psych  PSYCHIATRIC DISORDERS  Depression    negative neurological ROS     GI/Hepatic negative GI ROS, Neg liver ROS,,,  Endo/Other  Hypothyroidism    Renal/GU Renal disease  negative genitourinary   Musculoskeletal   Abdominal   Peds  Hematology negative hematology ROS (+)   Anesthesia Other Findings   Reproductive/Obstetrics negative OB ROS                             Anesthesia Physical Anesthesia Plan  ASA: 2  Anesthesia Plan: General and General LMA   Post-op Pain Management:    Induction:   PONV Risk Score and Plan: Ondansetron   Airway Management Planned:   Additional Equipment:   Intra-op Plan:   Post-operative Plan:   Informed Consent: I have reviewed the patients History and Physical, chart, labs and discussed the procedure including the risks, benefits and alternatives for the proposed anesthesia with the patient or authorized representative who has indicated his/her understanding and acceptance.     Dental Advisory Given  Plan Discussed with: CRNA  Anesthesia Plan Comments:        Anesthesia Quick Evaluation

## 2024-02-19 NOTE — Transfer of Care (Signed)
 Immediate Anesthesia Transfer of Care Note  Patient: Hector Kelley  Procedure(s) Performed: INSERTION, GOLD SEEDS (Prostate) INJECTION, HYDROGEL SPACER (Prostate)  Patient Location: PACU  Anesthesia Type:General  Level of Consciousness: awake and patient cooperative  Airway & Oxygen Therapy: Patient Spontanous Breathing and Patient connected to face mask oxygen  Post-op Assessment: Report given to RN and Post -op Vital signs reviewed and stable  Post vital signs: Reviewed and stable  Last Vitals:  Vitals Value Taken Time  BP 136/75 02/19/24 1507  Temp 36.4 C 02/19/24 1503  Pulse 78 02/19/24 1510  Resp 16 02/19/24 1510  SpO2 89 % 02/19/24 1510  Vitals shown include unfiled device data.  Last Pain: There were no vitals filed for this visit.       Complications: No notable events documented.

## 2024-02-19 NOTE — Op Note (Signed)
 PRE-OPERATIVE DIAGNOSIS:  Adenocarcinoma of the prostate  POST-OPERATIVE DIAGNOSIS:  Same  PROCEDURE: 1. Prostate Ultrasound 2. Placement of fiducial marks 3. Placement of SpaceOAR  SURGEON:  Surgeon(s): Johnie Nailer, MD  ANESTHESIA:  General  EBL:  Minimal  DRAINS: none  FINDINGS: 28.4 cc prostate volume on prostae ultrasound   INDICATION: Hector Kelley is a 78 year old with a history of T1c prostate cancer who is scheduled to undergo IMRT. He wishes to have fiducial markers and SpaceOAR placed prior to IMRT to decrease rectal toxicity.  Description of procedure: After informed consent the patient was brought to the major OR, placed on the table and administered general anesthesia. He was then moved to the modified lithotomy position with his perineum perpendicular to the floor. His perineum and genitalia were then sterilely prepped. An official timeout was then performed. The transrectal ultrasound probe was placed in the rectum and affixed to the stand. He was then sterilely draped.  A transrectal ultrasound of the prostate was performed.  Lidocaine   was not  instilled using ultrasound guidance into the junction of each seminal vesicle of the prostate.  3 Gold markers were placed into the prostate using the standard template and ultrasound guidance.  Accurate placement of the markers was confirmed.  We then proceeded to mix the SpaceOAR using the kit supplied from the manufacturer. Once this was complete we placed a sinal needle into the perirectal fat between the rectum and the prostate. Once this was accomplished we injected 2cc of normal saline to hydrodissect the plain. We then instilled the the SpaceOAR through the spinal needle and noted good distribution in the perirectal fat.   The patient was awakened and taken to recovery room in stable and satisfactory condition. He tolerated procedure well and there were no intraoperative complications.  CONDITION: Stable, extubated,  transferred to PACU  PLAN: The patient is to be discharged home and he will start IMRT in the next 2-3 weeks

## 2024-02-19 NOTE — Anesthesia Procedure Notes (Addendum)
 Procedure Name: Intubation Date/Time: 02/19/2024 2:34 PM  Performed by: Sloan Duncans, CRNAPre-anesthesia Checklist: Patient identified, Emergency Drugs available, Suction available, Patient being monitored and Timeout performed Patient Re-evaluated:Patient Re-evaluated prior to induction Oxygen Delivery Method: Circle system utilized Preoxygenation: Pre-oxygenation with 100% oxygen Induction Type: IV induction Ventilation: Oral airway inserted - appropriate to patient size Laryngoscope Size: Mac and 3 Grade View: Grade I Tube type: Oral Tube size: 7.5 mm Number of attempts: 1 Airway Equipment and Method: Stylet Placement Confirmation: ETT inserted through vocal cords under direct vision, positive ETCO2 and CO2 detector Secured at: 23 cm Tube secured with: Tape Dental Injury: Teeth and Oropharynx as per pre-operative assessment  Comments: Attempted to place Ambu AuraGain 4.0, unable to seat LMA. LMA removed, blood noted on tip, 100 mm OA place, manual ventilation until saturation from 92-98%. Uneventful intubation as noted above.

## 2024-02-19 NOTE — H&P (Signed)
 HPI: Hector Kelley is a 78yo here for fiducial markers and spaceoar for prostate cancer.  Biopsy revealed Gleason 3+4=7 in 2/14 cores and Gleason 4+3=7 in 4/14 cores. Prostate volume 19.5. He has a hx of a simple prostatectomy.      PMH:     Past Medical History:  Diagnosis Date   Arthritis     BPH with obstruction/lower urinary tract symptoms     Complication of anesthesia      urine retention, requiring catheter after anesthesia   Depression     Diverticulosis     H/O clavicle fracture      Right   Hypothyroidism     Wears contact lenses            Surgical History:      Past Surgical History:  Procedure Laterality Date   BACK SURGERY   1980    Lumbar Laminectomy    CHOLECYSTECTOMY       CIRCUMCISION       COLONOSCOPY       KNEE ARTHROSCOPY Left      2016   KNEE ARTHROSCOPY AND ARTHROTOMY Right 1980   XI ROBOTIC ASSISTED SIMPLE PROSTATECTOMY N/A 02/14/2019    Procedure: XI ROBOTIC ASSISTED SIMPLE PROSTATECTOMY;  Surgeon: Marco Severs, MD;  Location: [facility];  Service: Urology;  Laterality: N/A;  3 HRS          Home Medications:  Allergies as of 01/12/2024   No Known Allergies         Medication List           Accurate as of January 12, 2024  1:17 PM. If you have any questions, ask your nurse or doctor.              ARTIFICIAL TEARS OP Place 1 drop into both eyes daily as needed (dry eyes).    aspirin  EC 81 MG tablet Take 81 mg by mouth daily. Swallow whole.    cholecalciferol  25 MCG (1000 UNIT) tablet Commonly known as: VITAMIN D3 Take 1,000 Units by mouth daily.    glucosamine-chondroitin 500-400 MG tablet Take 1 tablet by mouth daily.    HYDROcodone -acetaminophen  5-325 MG tablet Commonly known as: NORCO/VICODIN Take 1 tablet by mouth every 6 (six) hours as needed for moderate pain or severe pain.    levothyroxine  125 MCG tablet Commonly known as: SYNTHROID  Take 125 mcg by mouth daily before breakfast.    MULTIVITAMIN ADULT PO Take 1  tablet by mouth daily.             Allergies:  Allergies  No Known Allergies     Family History: No family history on file.       Social History:  reports that he has never smoked. He has never used smokeless tobacco. He reports that he does not drink alcohol and does not use drugs.   ROS: All other review of systems were reviewed and are negative except what is noted above in HPI   Physical Exam: BP (!) 145/84   Pulse 83   Constitutional:  Alert and oriented, No acute distress. HEENT: [place] AT, moist mucus membranes.  Trachea midline, no masses. Cardiovascular: No clubbing, cyanosis, or edema. Respiratory: Normal respiratory effort, no increased work of breathing. GI: Abdomen is soft, nontender, nondistended, no abdominal masses GU: No CVA tenderness.  Lymph: No cervical or inguinal lymphadenopathy. Skin: No rashes, bruises or suspicious lesions. Neurologic: Grossly intact, no focal deficits, moving all 4 extremities. Psychiatric: Normal mood  and affect.   Laboratory Data: Recent Labs       Lab Results  Component Value Date    WBC 11.6 (H) 07/23/2022    HGB 12.2 (L) 07/23/2022    HCT 37.4 (L) 07/23/2022    MCV 94.4 07/23/2022    PLT 319 07/23/2022        Recent Labs       Lab Results  Component Value Date    CREATININE 0.92 07/23/2022        Recent Labs  No results found for: "PSA"     Recent Labs  No results found for: "TESTOSTERONE"     Recent Labs  No results found for: "HGBA1C"     Urinalysis Labs (Brief)          Component Value Date/Time    COLORURINE YELLOW 07/20/2022 1800    APPEARANCEUR Clear 11/01/2023 1057    LABSPEC 1.030 07/20/2022 1800    PHURINE 5.0 07/20/2022 1800    GLUCOSEU Negative 11/01/2023 1057    HGBUR LARGE (A) 07/20/2022 1800    BILIRUBINUR Negative 11/01/2023 1057    KETONESUR NEGATIVE 07/20/2022 1800    PROTEINUR Negative 11/01/2023 1057    PROTEINUR 100 (A) 07/20/2022 1800    NITRITE Negative 11/01/2023 1057     NITRITE NEGATIVE 07/20/2022 1800    LEUKOCYTESUR Negative 11/01/2023 1057    LEUKOCYTESUR SMALL (A) 07/20/2022 1800        Recent Labs       Lab Results  Component Value Date    LABMICR Comment 11/01/2023    WBCUA 0-5 09/28/2022    LABEPIT 0-10 09/28/2022    BACTERIA None seen 09/28/2022        Pertinent Imaging:   No results found for this or any previous visit.   No results found for this or any previous visit.   No results found for this or any previous visit.   No results found for this or any previous visit.   No results found for this or any previous visit.   No results found for this or any previous visit.   No results found for this or any previous visit.   No results found for this or any previous visit.     Assessment & Plan:     1. Prostate cancer (HCC) (Primary) I discussed the natural history of intermediate risk prostate cancer with the patient and the various treatment options including active surveillance, RALP, IMRT, brachytherapy, cryotherapy, HIFU and ADT. After discussing the options the patient elects for IMRT. The risks/benefits/alternatives to fiducial markers and SpaceOAR was explained to the patient and he understands and wishes to proceed with surgery

## 2024-02-20 ENCOUNTER — Encounter (HOSPITAL_COMMUNITY): Payer: Self-pay | Admitting: Urology

## 2024-02-21 ENCOUNTER — Ambulatory Visit: Admitting: Urology

## 2024-02-22 NOTE — Anesthesia Postprocedure Evaluation (Signed)
 Anesthesia Post Note  Patient: Hector Kelley  Procedure(s) Performed: INSERTION, GOLD SEEDS (Prostate) INJECTION, HYDROGEL SPACER (Prostate)  Patient location during evaluation: Phase II Anesthesia Type: General Level of consciousness: awake Pain management: pain level controlled Vital Signs Assessment: post-procedure vital signs reviewed and stable Respiratory status: spontaneous breathing and respiratory function stable Cardiovascular status: blood pressure returned to baseline and stable Postop Assessment: no headache and no apparent nausea or vomiting Anesthetic complications: no Comments: Late entry   No notable events documented.   Last Vitals:  Vitals:   02/19/24 1515 02/19/24 1544  BP: 131/69 119/75  Pulse: 72 64  Resp: 16 16  Temp:  37.1 C  SpO2: 98% 100%    Last Pain:  Vitals:   02/20/24 1553  TempSrc:   PainSc: 0-No pain                 Coretha Dew

## 2024-02-23 DIAGNOSIS — C61 Malignant neoplasm of prostate: Secondary | ICD-10-CM | POA: Diagnosis not present

## 2024-02-26 ENCOUNTER — Other Ambulatory Visit: Payer: Self-pay

## 2024-02-26 DIAGNOSIS — C61 Malignant neoplasm of prostate: Secondary | ICD-10-CM

## 2024-02-26 MED ORDER — LEUPROLIDE ACETATE (6 MONTH) 45 MG ~~LOC~~ KIT
45.0000 mg | PACK | SUBCUTANEOUS | Status: AC
  Administered 2024-03-07: 45 mg via SUBCUTANEOUS

## 2024-02-27 ENCOUNTER — Ambulatory Visit

## 2024-03-06 DIAGNOSIS — C61 Malignant neoplasm of prostate: Secondary | ICD-10-CM | POA: Diagnosis not present

## 2024-03-06 NOTE — Progress Notes (Unsigned)
 Name: Hector Kelley DOB: 1946/08/12 MRN: [id number]  Diagnoses: Post-operative state  HPI: He presents postoperatively. Relevant History includes:  1. Prostate cancer (T1c).  - Prior simple prostatectomy. - Will be starting IMRT with Radiation Oncology at [facility] (Dr. Cipriano Creeks).  He underwent the following procedures by Dr. Claretta Croft on 02/19/2024:  PRE-OPERATIVE DIAGNOSIS:  Adenocarcinoma of the prostate   POST-OPERATIVE DIAGNOSIS:  Same  PROCEDURE:  1. Prostate Ultrasound 2. Placement of fiducial marks 3. Placement of SpaceOAR  Postop course: Today: He denies soreness, pain, redness, warmth, tenderness, swelling, or drainage at the incision site(s). He denies fevers.  He denies increased urinary urgency, frequency, nocturia, dysuria, gross hematuria, hesitancy, straining to void, or sensations of incomplete emptying.  Medications: Current Outpatient Medications  Medication Sig Dispense Refill   aspirin  EC 81 MG tablet Take 81 mg by mouth daily. Swallow whole.     Carboxymethylcellulose Sodium (ARTIFICIAL TEARS OP) Place 1 drop into both eyes daily as needed (dry eyes).     cholecalciferol  (VITAMIN D3) 25 MCG (1000 UNIT) tablet Take 1,000 Units by mouth daily.     glucosamine-chondroitin 500-400 MG tablet Take 1 tablet by mouth daily.     levothyroxine  (SYNTHROID , LEVOTHROID) 125 MCG tablet Take 125 mcg by mouth daily before breakfast.     Multiple Vitamin (MULTIVITAMIN ADULT PO) Take 1 tablet by mouth daily.     HYDROcodone -acetaminophen  (NORCO/VICODIN) 5-325 MG tablet Take 1 tablet by mouth every 6 (six) hours as needed for moderate pain (pain score 4-6) or severe pain (pain score 7-10). (Patient not taking: Reported on 03/07/2024) 15 tablet 0   Current Facility-Administered Medications  Medication Dose Route Frequency Provider Last Rate Last Admin   leuprolide  (6 Month) (ELIGARD ) injection 45 mg  45 mg Subcutaneous Q6 months McKenzie, Arden Beck, MD   45 mg at  03/07/24 0848    Allergies: No Known Allergies  Past Medical History:  Diagnosis Date   Arthritis    BPH with obstruction/lower urinary tract symptoms    Complication of anesthesia    urine retention, requiring catheter after anesthesia   Depression    Diverticulosis    H/O clavicle fracture    Right   Hypothyroidism    Wears contact lenses    Past Surgical History:  Procedure Laterality Date   BACK SURGERY  1980   Lumbar Laminectomy    CHOLECYSTECTOMY     CIRCUMCISION     COLONOSCOPY     GOLD SEED IMPLANT N/A 02/19/2024   Procedure: INSERTION, GOLD SEEDS;  Surgeon: Marco Severs, MD;  Location: [facility];  Service: Urology;  Laterality: N/A;   KNEE ARTHROSCOPY Left    2016   KNEE ARTHROSCOPY AND ARTHROTOMY Right 1980   SPACE OAR INSTILLATION N/A 02/19/2024   Procedure: INJECTION, HYDROGEL SPACER;  Surgeon: Marco Severs, MD;  Location: [facility];  Service: Urology;  Laterality: N/A;   XI ROBOTIC ASSISTED SIMPLE PROSTATECTOMY N/A 02/14/2019   Procedure: XI ROBOTIC ASSISTED SIMPLE PROSTATECTOMY;  Surgeon: Marco Severs, MD;  Location: [facility];  Service: Urology;  Laterality: N/A;  3 HRS   History reviewed. No pertinent family history. Social History   Socioeconomic History   Marital status: Married    Spouse name: Not on file   Number of children: Not on file   Years of education: Not on file   Highest education level: Not on file  Occupational History   Not on file  Tobacco Use   Smoking status: Never  Smokeless tobacco: Never  Vaping Use   Vaping status: Never Used  Substance and Sexual Activity   Alcohol use: Never   Drug use: Never   Sexual activity: Yes  Other Topics Concern   Not on file  Social History Narrative   Not on file   Social Drivers of Health   Financial Resource Strain: Not on file  Food Insecurity: No Food Insecurity (01/19/2024)   Received from [facility]   Hunger Vital Sign    Within the past 12 months, you worried that  your food would run out before you got the money to buy more.: Never true    Within the past 12 months, the food you bought just didn't last and you didn't have money to get more.: Never true  Transportation Needs: No Transportation Needs (01/19/2024)   Received from [facility] - Transportation    Lack of Transportation (Medical): No    Lack of Transportation (Non-Medical): No  Physical Activity: Not on file  Stress: Not on file  Social Connections: Not on file  Intimate Partner Violence: Not At Risk (07/20/2022)   Humiliation, Afraid, Rape, and Kick questionnaire    Fear of Current or Ex-Partner: No    Emotionally Abused: No    Physically Abused: No    Sexually Abused: No    SUBJECTIVE  Review of Systems Constitutional: Patient denies any unintentional weight loss or change in strength lntegumentary: Patient denies any rashes or pruritus Cardiovascular: Patient denies chest pain or syncope Respiratory: Patient denies shortness of breath Gastrointestinal: Patient reports constipation; managed with prune juice Musculoskeletal: Patient denies muscle cramps or weakness Neurologic: Patient denies convulsions or seizures Allergic/Immunologic: Patient denies recent allergic reaction(s) Hematologic/Lymphatic: Patient denies bleeding tendencies Endocrine: Patient denies heat/cold intolerance  GU: As per HPI.  OBJECTIVE Vitals:   03/07/24 0842  BP: 134/73  Pulse: (!) 59   There is no height or weight on file to calculate BMI.  Physical Examination Constitutional: No obvious distress; patient is non-toxic appearing  Cardiovascular: No visible lower extremity edema.  Respiratory: The patient does not have audible wheezing/stridor; respirations do not appear labored  Gastrointestinal: Abdomen non-distended Musculoskeletal: Normal ROM of UEs  Skin: No obvious rashes/open sores  Neurologic: CN 2-12 grossly intact Psychiatric: Answered questions appropriately with  normal affect  Hematologic/Lymphatic/Immunologic: No obvious bruises or sites of spontaneous bleeding  Urine microscopy: unremarkable PVR: 0 ml  ASSESSMENT Malignant neoplasm prostate (HCC) - Plan: Urinalysis, Routine w reflex microscopic, BLADDER SCAN AMB NON-IMAGING  Postop check - Plan: Urinalysis, Routine w reflex microscopic, BLADDER SCAN AMB NON-IMAGING  We reviewed the operative procedures and findings. Pain is well controlled. Eligard  today. We agreed to plan for follow up in 3 months or sooner if needed. Patient verbalized understanding of and agreement with current plan. All questions were answered.  PLAN Advised the following: Eligard  today. Starting IMRT with Radiation Oncology (Dr. Cipriano Creeks). Return in about 3 months (around 06/07/2024) for f/u with Dr. Claretta Croft.   Orders Placed This Encounter  Procedures   Urinalysis, Routine w reflex microscopic   BLADDER SCAN AMB NON-IMAGING    It has been explained that the patient is to follow regularly with their PCP in addition to all other providers involved in their care and to follow instructions provided by these respective offices. Patient advised to contact urology clinic if any urologic-pertaining questions, concerns, new symptoms or problems arise in the interim period.  There are no Patient Instructions on file for  this visit.  Electronically signed by:  Lauretta Ponto, MSN, FNP-C, CUNP 03/07/2024 9:09 AM

## 2024-03-07 ENCOUNTER — Ambulatory Visit: Admitting: Urology

## 2024-03-07 ENCOUNTER — Encounter: Payer: Self-pay | Admitting: Urology

## 2024-03-07 VITALS — BP 134/73 | HR 59

## 2024-03-07 DIAGNOSIS — Z09 Encounter for follow-up examination after completed treatment for conditions other than malignant neoplasm: Secondary | ICD-10-CM

## 2024-03-07 DIAGNOSIS — C61 Malignant neoplasm of prostate: Secondary | ICD-10-CM

## 2024-03-07 DIAGNOSIS — Z08 Encounter for follow-up examination after completed treatment for malignant neoplasm: Secondary | ICD-10-CM

## 2024-03-07 LAB — URINALYSIS, ROUTINE W REFLEX MICROSCOPIC
Bilirubin, UA: NEGATIVE
Glucose, UA: NEGATIVE
Ketones, UA: NEGATIVE
Leukocytes,UA: NEGATIVE
Nitrite, UA: NEGATIVE
Protein,UA: NEGATIVE
Specific Gravity, UA: 1.025 (ref 1.005–1.030)
Urobilinogen, Ur: 0.2 mg/dL (ref 0.2–1.0)
pH, UA: 5.5 (ref 5.0–7.5)

## 2024-03-07 LAB — MICROSCOPIC EXAMINATION
Bacteria, UA: NONE SEEN
Epithelial Cells (non renal): NONE SEEN /HPF (ref 0–10)
WBC, UA: NONE SEEN /HPF (ref 0–5)

## 2024-03-07 LAB — BLADDER SCAN AMB NON-IMAGING: Scan Result: 0

## 2024-03-07 NOTE — Progress Notes (Signed)
 Eligard  SubQ Injection   Due to Prostate Cancer patient is present today for a Eligard  Injection. Order reviewed. Prior Authorization reviewed.  Medication: Eligard  6 month Dose: 45 mg  Location: right lower abdomen  site prepped and cleaned with alcohol prior to giving injection.  Lot: [id number] Exp: 05/2025  Patient tolerated well, no complications were noted  Performed by: Gorden Latino, CMA

## 2024-03-11 DIAGNOSIS — C61 Malignant neoplasm of prostate: Secondary | ICD-10-CM | POA: Diagnosis not present

## 2024-03-12 DIAGNOSIS — C61 Malignant neoplasm of prostate: Secondary | ICD-10-CM | POA: Diagnosis not present

## 2024-03-13 DIAGNOSIS — C61 Malignant neoplasm of prostate: Secondary | ICD-10-CM | POA: Diagnosis not present

## 2024-03-14 DIAGNOSIS — C61 Malignant neoplasm of prostate: Secondary | ICD-10-CM | POA: Diagnosis not present

## 2024-03-15 DIAGNOSIS — C61 Malignant neoplasm of prostate: Secondary | ICD-10-CM | POA: Diagnosis not present

## 2024-03-18 DIAGNOSIS — C61 Malignant neoplasm of prostate: Secondary | ICD-10-CM | POA: Diagnosis not present

## 2024-03-19 DIAGNOSIS — C61 Malignant neoplasm of prostate: Secondary | ICD-10-CM | POA: Diagnosis not present

## 2024-03-20 DIAGNOSIS — C61 Malignant neoplasm of prostate: Secondary | ICD-10-CM | POA: Diagnosis not present

## 2024-03-21 DIAGNOSIS — C61 Malignant neoplasm of prostate: Secondary | ICD-10-CM | POA: Diagnosis not present

## 2024-03-25 DIAGNOSIS — C61 Malignant neoplasm of prostate: Secondary | ICD-10-CM | POA: Diagnosis not present

## 2024-03-26 DIAGNOSIS — C61 Malignant neoplasm of prostate: Secondary | ICD-10-CM | POA: Diagnosis not present

## 2024-03-27 DIAGNOSIS — C61 Malignant neoplasm of prostate: Secondary | ICD-10-CM | POA: Diagnosis not present

## 2024-03-28 DIAGNOSIS — C61 Malignant neoplasm of prostate: Secondary | ICD-10-CM | POA: Diagnosis not present

## 2024-03-29 DIAGNOSIS — C61 Malignant neoplasm of prostate: Secondary | ICD-10-CM | POA: Diagnosis not present

## 2024-04-01 DIAGNOSIS — C61 Malignant neoplasm of prostate: Secondary | ICD-10-CM | POA: Diagnosis not present

## 2024-04-02 DIAGNOSIS — C61 Malignant neoplasm of prostate: Secondary | ICD-10-CM | POA: Diagnosis not present

## 2024-04-03 DIAGNOSIS — C61 Malignant neoplasm of prostate: Secondary | ICD-10-CM | POA: Diagnosis not present

## 2024-04-04 DIAGNOSIS — C61 Malignant neoplasm of prostate: Secondary | ICD-10-CM | POA: Diagnosis not present

## 2024-04-05 DIAGNOSIS — C61 Malignant neoplasm of prostate: Secondary | ICD-10-CM | POA: Diagnosis not present

## 2024-04-08 DIAGNOSIS — C61 Malignant neoplasm of prostate: Secondary | ICD-10-CM | POA: Diagnosis not present

## 2024-04-09 DIAGNOSIS — C61 Malignant neoplasm of prostate: Secondary | ICD-10-CM | POA: Diagnosis not present

## 2024-04-10 DIAGNOSIS — C61 Malignant neoplasm of prostate: Secondary | ICD-10-CM | POA: Diagnosis not present

## 2024-04-11 DIAGNOSIS — C61 Malignant neoplasm of prostate: Secondary | ICD-10-CM | POA: Diagnosis not present

## 2024-04-12 DIAGNOSIS — C61 Malignant neoplasm of prostate: Secondary | ICD-10-CM | POA: Diagnosis not present

## 2024-04-15 DIAGNOSIS — C61 Malignant neoplasm of prostate: Secondary | ICD-10-CM | POA: Diagnosis not present

## 2024-04-16 DIAGNOSIS — C61 Malignant neoplasm of prostate: Secondary | ICD-10-CM | POA: Diagnosis not present

## 2024-04-17 DIAGNOSIS — C61 Malignant neoplasm of prostate: Secondary | ICD-10-CM | POA: Diagnosis not present

## 2024-04-18 DIAGNOSIS — C61 Malignant neoplasm of prostate: Secondary | ICD-10-CM | POA: Diagnosis not present

## 2024-05-21 DIAGNOSIS — C61 Malignant neoplasm of prostate: Secondary | ICD-10-CM | POA: Diagnosis not present

## 2024-05-21 DIAGNOSIS — E291 Testicular hypofunction: Secondary | ICD-10-CM | POA: Diagnosis not present

## 2024-06-22 DIAGNOSIS — L0100 Impetigo, unspecified: Secondary | ICD-10-CM | POA: Diagnosis not present

## 2024-07-05 ENCOUNTER — Ambulatory Visit: Admitting: Urology

## 2024-07-05 ENCOUNTER — Encounter: Payer: Self-pay | Admitting: Urology

## 2024-07-05 VITALS — BP 115/57 | HR 76

## 2024-07-05 DIAGNOSIS — C61 Malignant neoplasm of prostate: Secondary | ICD-10-CM

## 2024-07-05 LAB — MICROSCOPIC EXAMINATION
Bacteria, UA: NONE SEEN
WBC, UA: NONE SEEN /HPF (ref 0–5)

## 2024-07-05 LAB — URINALYSIS, ROUTINE W REFLEX MICROSCOPIC
Bilirubin, UA: NEGATIVE
Glucose, UA: NEGATIVE
Ketones, UA: NEGATIVE
Leukocytes,UA: NEGATIVE
Nitrite, UA: NEGATIVE
Protein,UA: NEGATIVE
Specific Gravity, UA: 1.03 (ref 1.005–1.030)
Urobilinogen, Ur: 0.2 mg/dL (ref 0.2–1.0)
pH, UA: 5.5 (ref 5.0–7.5)

## 2024-07-05 NOTE — Patient Instructions (Signed)

## 2024-07-05 NOTE — Progress Notes (Signed)
 07/05/2024 10:42 AM   Hector Kelley September 30, 1945 [id number]  Referring provider: Lari Elspeth BRAVO, MD [address] [place],  [place] [address]  Followup prostate    HPI: Hector Kelley is a 77yo here for followup for unfavorable intermediate risk prostate cancer. PSA 0.26. He finished IMRT. IPSS 7 QOL 2. Nocturia 3x.    PMH: Past Medical History:  Diagnosis Date   Arthritis    BPH with obstruction/lower urinary tract symptoms    Complication of anesthesia    urine retention, requiring catheter after anesthesia   Depression    Diverticulosis    H/O clavicle fracture    Right   Hypothyroidism    Wears contact lenses     Surgical History: Past Surgical History:  Procedure Laterality Date   BACK SURGERY  1980   Lumbar Laminectomy    CHOLECYSTECTOMY     CIRCUMCISION     COLONOSCOPY     GOLD SEED IMPLANT N/A 02/19/2024   Procedure: INSERTION, GOLD SEEDS;  Surgeon: Sherrilee Belvie CROME, MD;  Location: [facility];  Service: Urology;  Laterality: N/A;   KNEE ARTHROSCOPY Left    2016   KNEE ARTHROSCOPY AND ARTHROTOMY Right 1980   SPACE OAR INSTILLATION N/A 02/19/2024   Procedure: INJECTION, HYDROGEL SPACER;  Surgeon: Sherrilee Belvie CROME, MD;  Location: [facility];  Service: Urology;  Laterality: N/A;   XI ROBOTIC ASSISTED SIMPLE PROSTATECTOMY N/A 02/14/2019   Procedure: XI ROBOTIC ASSISTED SIMPLE PROSTATECTOMY;  Surgeon: Sherrilee Belvie CROME, MD;  Location: [facility];  Service: Urology;  Laterality: N/A;  3 HRS    Home Medications:  Allergies as of 07/05/2024   No Known Allergies      Medication List        Accurate as of July 05, 2024 10:42 AM. If you have any questions, ask your nurse or doctor.          ARTIFICIAL TEARS OP Place 1 drop into both eyes daily as needed (dry eyes).   aspirin  EC 81 MG tablet Take 81 mg by mouth daily. Swallow whole.   cholecalciferol  25 MCG (1000 UNIT) tablet Commonly known as: VITAMIN D3 Take 1,000 Units by mouth daily.    glucosamine-chondroitin 500-400 MG tablet Take 1 tablet by mouth daily.   HYDROcodone -acetaminophen  5-325 MG tablet Commonly known as: NORCO/VICODIN Take 1 tablet by mouth every 6 (six) hours as needed for moderate pain (pain score 4-6) or severe pain (pain score 7-10).   levothyroxine  125 MCG tablet Commonly known as: SYNTHROID  Take 125 mcg by mouth daily before breakfast.   MULTIVITAMIN ADULT PO Take 1 tablet by mouth daily.        Allergies: No Known Allergies  Family History: No family history on file.  Social History:  reports that he has never smoked. He has never used smokeless tobacco. He reports that he does not drink alcohol and does not use drugs.  ROS: All other review of systems were reviewed and are negative except what is noted above in HPI  Physical Exam: BP (!) 115/57   Pulse 76   Constitutional:  Alert and oriented, No acute distress. HEENT: [place] AT, moist mucus membranes.  Trachea midline, no masses. Cardiovascular: No clubbing, cyanosis, or edema. Respiratory: Normal respiratory effort, no increased work of breathing. GI: Abdomen is soft, nontender, nondistended, no abdominal masses GU: No CVA tenderness.  Lymph: No cervical or inguinal lymphadenopathy. Skin: No rashes, bruises or suspicious lesions. Neurologic: Grossly intact, no focal deficits, moving all 4 extremities. Psychiatric:  Normal mood and affect.  Laboratory Data: Lab Results  Component Value Date   WBC 11.6 (H) 07/23/2022   HGB 12.2 (L) 07/23/2022   HCT 37.4 (L) 07/23/2022   MCV 94.4 07/23/2022   PLT 319 07/23/2022    Lab Results  Component Value Date   CREATININE 0.92 07/23/2022    No results found for: PSA  No results found for: TESTOSTERONE  No results found for: HGBA1C  Urinalysis    Component Value Date/Time   COLORURINE YELLOW 07/20/2022 1800   APPEARANCEUR Clear 03/07/2024 0837   LABSPEC 1.030 07/20/2022 1800   PHURINE 5.0 07/20/2022 1800   GLUCOSEU  Negative 03/07/2024 0837   HGBUR LARGE (A) 07/20/2022 1800   BILIRUBINUR Negative 03/07/2024 0837   KETONESUR NEGATIVE 07/20/2022 1800   PROTEINUR Negative 03/07/2024 0837   PROTEINUR 100 (A) 07/20/2022 1800   NITRITE Negative 03/07/2024 0837   NITRITE NEGATIVE 07/20/2022 1800   LEUKOCYTESUR Negative 03/07/2024 0837   LEUKOCYTESUR SMALL (A) 07/20/2022 1800    Lab Results  Component Value Date   LABMICR See below: 03/07/2024   WBCUA None seen 03/07/2024   LABEPIT None seen 03/07/2024   BACTERIA None seen 03/07/2024    Pertinent Imaging:  No results found for this or any previous visit.  No results found for this or any previous visit.  No results found for this or any previous visit.  No results found for this or any previous visit.  No results found for this or any previous visit.  No results found for this or any previous visit.  No results found for this or any previous visit.  No results found for this or any previous visit.   Assessment & Plan:    1. Malignant neoplasm prostate (HCC) (Primary) -folowup 6 months with PSA - Urinalysis, Routine w reflex microscopic   No follow-ups on file.  Belvie Clara, MD  [facility] [place]

## 2024-08-12 DIAGNOSIS — L570 Actinic keratosis: Secondary | ICD-10-CM | POA: Diagnosis not present

## 2024-08-12 DIAGNOSIS — D485 Neoplasm of uncertain behavior of skin: Secondary | ICD-10-CM | POA: Diagnosis not present

## 2024-12-27 ENCOUNTER — Other Ambulatory Visit

## 2025-01-03 ENCOUNTER — Ambulatory Visit: Admitting: Urology
